# Patient Record
Sex: Female | Born: 1955 | Race: White | Hispanic: No | Smoking: Never smoker
Health system: Southern US, Community
[De-identification: ages and names within clinical notes are randomized; demographics above are authoritative.]

## PROBLEM LIST (undated history)

## (undated) DIAGNOSIS — I10 Essential (primary) hypertension: Secondary | ICD-10-CM

## (undated) HISTORY — PX: CARPAL TUNNEL RELEASE: SHX101

## (undated) HISTORY — PX: APPENDECTOMY: SHX54

---

## 1999-10-17 HISTORY — PX: BREAST BIOPSY: SHX20

## 2018-05-10 ENCOUNTER — Other Ambulatory Visit: Payer: Self-pay | Admitting: Family Medicine

## 2018-05-10 DIAGNOSIS — Z1231 Encounter for screening mammogram for malignant neoplasm of breast: Secondary | ICD-10-CM

## 2018-05-13 ENCOUNTER — Other Ambulatory Visit: Payer: Self-pay | Admitting: Family Medicine

## 2018-05-13 DIAGNOSIS — R1310 Dysphagia, unspecified: Secondary | ICD-10-CM

## 2018-05-15 ENCOUNTER — Ambulatory Visit
Admission: RE | Admit: 2018-05-15 | Discharge: 2018-05-15 | Disposition: A | Discharge status: Home / Self Care | Payer: Managed Care, Other (non HMO) | Source: Ambulatory Visit | Attending: Family Medicine | Admitting: Family Medicine

## 2018-05-15 DIAGNOSIS — R1310 Dysphagia, unspecified: Secondary | ICD-10-CM

## 2018-06-03 ENCOUNTER — Encounter: Payer: Self-pay | Admitting: Radiology

## 2018-06-03 ENCOUNTER — Ambulatory Visit
Admission: RE | Admit: 2018-06-03 | Discharge: 2018-06-03 | Disposition: A | Discharge status: Home / Self Care | Payer: Managed Care, Other (non HMO) | Source: Ambulatory Visit | Attending: Family Medicine | Admitting: Family Medicine

## 2018-06-03 DIAGNOSIS — Z1231 Encounter for screening mammogram for malignant neoplasm of breast: Secondary | ICD-10-CM

## 2018-11-07 DIAGNOSIS — K219 Gastro-esophageal reflux disease without esophagitis: Secondary | ICD-10-CM | POA: Diagnosis not present

## 2018-11-07 DIAGNOSIS — F1020 Alcohol dependence, uncomplicated: Secondary | ICD-10-CM | POA: Diagnosis not present

## 2018-11-07 DIAGNOSIS — F321 Major depressive disorder, single episode, moderate: Secondary | ICD-10-CM | POA: Diagnosis not present

## 2019-03-07 DIAGNOSIS — Z79891 Long term (current) use of opiate analgesic: Secondary | ICD-10-CM | POA: Diagnosis not present

## 2019-04-02 DIAGNOSIS — J029 Acute pharyngitis, unspecified: Secondary | ICD-10-CM | POA: Diagnosis not present

## 2019-04-02 DIAGNOSIS — G44209 Tension-type headache, unspecified, not intractable: Secondary | ICD-10-CM | POA: Diagnosis not present

## 2019-04-04 DIAGNOSIS — J029 Acute pharyngitis, unspecified: Secondary | ICD-10-CM | POA: Diagnosis not present

## 2019-04-04 DIAGNOSIS — G44209 Tension-type headache, unspecified, not intractable: Secondary | ICD-10-CM | POA: Diagnosis not present

## 2019-06-06 DIAGNOSIS — F321 Major depressive disorder, single episode, moderate: Secondary | ICD-10-CM | POA: Diagnosis not present

## 2019-06-06 DIAGNOSIS — F1020 Alcohol dependence, uncomplicated: Secondary | ICD-10-CM | POA: Diagnosis not present

## 2019-06-06 DIAGNOSIS — F419 Anxiety disorder, unspecified: Secondary | ICD-10-CM | POA: Diagnosis not present

## 2019-06-06 DIAGNOSIS — K219 Gastro-esophageal reflux disease without esophagitis: Secondary | ICD-10-CM | POA: Diagnosis not present

## 2019-07-08 DIAGNOSIS — F438 Other reactions to severe stress: Secondary | ICD-10-CM | POA: Diagnosis not present

## 2019-07-14 ENCOUNTER — Other Ambulatory Visit: Payer: Self-pay | Admitting: Family Medicine

## 2019-07-14 DIAGNOSIS — Z1231 Encounter for screening mammogram for malignant neoplasm of breast: Secondary | ICD-10-CM

## 2019-07-15 ENCOUNTER — Ambulatory Visit: Payer: Managed Care, Other (non HMO)

## 2019-07-18 ENCOUNTER — Ambulatory Visit
Admission: RE | Admit: 2019-07-18 | Discharge: 2019-07-18 | Disposition: A | Discharge status: Home / Self Care | Payer: Managed Care, Other (non HMO) | Source: Ambulatory Visit | Attending: Family Medicine | Admitting: Family Medicine

## 2019-07-18 ENCOUNTER — Other Ambulatory Visit: Payer: Self-pay

## 2019-07-18 DIAGNOSIS — F1021 Alcohol dependence, in remission: Secondary | ICD-10-CM | POA: Diagnosis not present

## 2019-07-18 DIAGNOSIS — F438 Other reactions to severe stress: Secondary | ICD-10-CM | POA: Diagnosis not present

## 2019-07-18 DIAGNOSIS — Z1231 Encounter for screening mammogram for malignant neoplasm of breast: Secondary | ICD-10-CM

## 2019-07-29 DIAGNOSIS — F438 Other reactions to severe stress: Secondary | ICD-10-CM | POA: Diagnosis not present

## 2019-07-29 DIAGNOSIS — F1021 Alcohol dependence, in remission: Secondary | ICD-10-CM | POA: Diagnosis not present

## 2019-08-12 DIAGNOSIS — F438 Other reactions to severe stress: Secondary | ICD-10-CM | POA: Diagnosis not present

## 2019-08-12 DIAGNOSIS — F1021 Alcohol dependence, in remission: Secondary | ICD-10-CM | POA: Diagnosis not present

## 2019-08-26 DIAGNOSIS — F1021 Alcohol dependence, in remission: Secondary | ICD-10-CM | POA: Diagnosis not present

## 2019-08-26 DIAGNOSIS — F438 Other reactions to severe stress: Secondary | ICD-10-CM | POA: Diagnosis not present

## 2019-09-07 ENCOUNTER — Emergency Department (HOSPITAL_COMMUNITY)
Admission: EM | Admit: 2019-09-07 | Discharge: 2019-09-08 | Disposition: A | Discharge status: Other Acute Inpatient Hospital | Payer: BC Managed Care – PPO | Attending: Emergency Medicine | Admitting: Emergency Medicine

## 2019-09-07 ENCOUNTER — Emergency Department (HOSPITAL_COMMUNITY): Payer: BC Managed Care – PPO

## 2019-09-07 ENCOUNTER — Other Ambulatory Visit: Payer: Self-pay

## 2019-09-07 ENCOUNTER — Encounter (HOSPITAL_COMMUNITY): Payer: Self-pay

## 2019-09-07 DIAGNOSIS — F4310 Post-traumatic stress disorder, unspecified: Secondary | ICD-10-CM | POA: Insufficient documentation

## 2019-09-07 DIAGNOSIS — Z79899 Other long term (current) drug therapy: Secondary | ICD-10-CM | POA: Insufficient documentation

## 2019-09-07 DIAGNOSIS — F1021 Alcohol dependence, in remission: Secondary | ICD-10-CM

## 2019-09-07 DIAGNOSIS — R45851 Suicidal ideations: Secondary | ICD-10-CM | POA: Diagnosis not present

## 2019-09-07 DIAGNOSIS — F10920 Alcohol use, unspecified with intoxication, uncomplicated: Secondary | ICD-10-CM

## 2019-09-07 DIAGNOSIS — I10 Essential (primary) hypertension: Secondary | ICD-10-CM | POA: Insufficient documentation

## 2019-09-07 DIAGNOSIS — F322 Major depressive disorder, single episode, severe without psychotic features: Secondary | ICD-10-CM | POA: Insufficient documentation

## 2019-09-07 DIAGNOSIS — F1010 Alcohol abuse, uncomplicated: Secondary | ICD-10-CM | POA: Insufficient documentation

## 2019-09-07 DIAGNOSIS — F10129 Alcohol abuse with intoxication, unspecified: Secondary | ICD-10-CM | POA: Diagnosis not present

## 2019-09-07 DIAGNOSIS — Y908 Blood alcohol level of 240 mg/100 ml or more: Secondary | ICD-10-CM | POA: Insufficient documentation

## 2019-09-07 DIAGNOSIS — Z20828 Contact with and (suspected) exposure to other viral communicable diseases: Secondary | ICD-10-CM | POA: Insufficient documentation

## 2019-09-07 DIAGNOSIS — Z03818 Encounter for observation for suspected exposure to other biological agents ruled out: Secondary | ICD-10-CM | POA: Diagnosis not present

## 2019-09-07 HISTORY — DX: Essential (primary) hypertension: I10

## 2019-09-07 LAB — CBC WITH DIFFERENTIAL/PLATELET
Abs Immature Granulocytes: 0.01 10*3/uL (ref 0.00–0.07)
Basophils Absolute: 0 10*3/uL (ref 0.0–0.1)
Basophils Relative: 1 %
Eosinophils Absolute: 0.1 10*3/uL (ref 0.0–0.5)
Eosinophils Relative: 2 %
HCT: 44.1 % (ref 36.0–46.0)
Hemoglobin: 15 g/dL (ref 12.0–15.0)
Immature Granulocytes: 0 %
Lymphocytes Relative: 30 %
Lymphs Abs: 1.2 10*3/uL (ref 0.7–4.0)
MCH: 31.3 pg (ref 26.0–34.0)
MCHC: 34 g/dL (ref 30.0–36.0)
MCV: 91.9 fL (ref 80.0–100.0)
Monocytes Absolute: 0.2 10*3/uL (ref 0.1–1.0)
Monocytes Relative: 5 %
Neutro Abs: 2.4 10*3/uL (ref 1.7–7.7)
Neutrophils Relative %: 62 %
Platelets: 143 10*3/uL — ABNORMAL LOW (ref 150–400)
RBC: 4.8 MIL/uL (ref 3.87–5.11)
RDW: 13.9 % (ref 11.5–15.5)
WBC: 3.9 10*3/uL — ABNORMAL LOW (ref 4.0–10.5)
nRBC: 0 % (ref 0.0–0.2)

## 2019-09-07 LAB — RAPID URINE DRUG SCREEN, HOSP PERFORMED
Amphetamines: NOT DETECTED
Barbiturates: NOT DETECTED
Benzodiazepines: NOT DETECTED
Cocaine: NOT DETECTED
Opiates: NOT DETECTED
Tetrahydrocannabinol: NOT DETECTED

## 2019-09-07 LAB — COMPREHENSIVE METABOLIC PANEL
ALT: 31 U/L (ref 0–44)
AST: 41 U/L (ref 15–41)
Albumin: 4.5 g/dL (ref 3.5–5.0)
Alkaline Phosphatase: 70 U/L (ref 38–126)
Anion gap: 14 (ref 5–15)
BUN: 7 mg/dL — ABNORMAL LOW (ref 8–23)
CO2: 23 mmol/L (ref 22–32)
Calcium: 8.7 mg/dL — ABNORMAL LOW (ref 8.9–10.3)
Chloride: 105 mmol/L (ref 98–111)
Creatinine, Ser: 0.62 mg/dL (ref 0.44–1.00)
GFR calc Af Amer: >60 mL/min (ref >60)
GFR calc non Af Amer: >60 mL/min (ref >60)
Glucose, Bld: 86 mg/dL (ref 70–99)
Potassium: 4.1 mmol/L (ref 3.5–5.1)
Sodium: 142 mmol/L (ref 135–145)
Total Bilirubin: 0.5 mg/dL (ref 0.3–1.2)
Total Protein: 7.2 g/dL (ref 6.5–8.1)

## 2019-09-07 LAB — MAGNESIUM: Magnesium: 2.2 mg/dL (ref 1.7–2.4)

## 2019-09-07 LAB — ACETAMINOPHEN LEVEL: Acetaminophen (Tylenol), Serum: <10 ug/mL — ABNORMAL LOW (ref 10–30)

## 2019-09-07 LAB — SALICYLATE LEVEL: Salicylate Lvl: <7 mg/dL (ref 2.8–30.0)

## 2019-09-07 LAB — ETHANOL: Alcohol, Ethyl (B): 322 mg/dL (ref <10)

## 2019-09-07 MED ORDER — LORAZEPAM 1 MG PO TABS
0.0000 mg | ORAL_TABLET | Freq: Four times a day (QID) | ORAL | Status: DC
  Administered 2019-09-07: 2 mg via ORAL
  Administered 2019-09-08 (×2): 1 mg via ORAL
  Filled 2019-09-07: qty 1
  Filled 2019-09-07: qty 2
  Filled 2019-09-07 (×2): qty 1

## 2019-09-07 MED ORDER — THIAMINE HCL 100 MG/ML IJ SOLN: 100.0000 mg | Freq: Every day | INTRAMUSCULAR | Status: DC

## 2019-09-07 MED ORDER — LORAZEPAM 2 MG/ML IJ SOLN: 0.0000 mg | Freq: Four times a day (QID) | INTRAMUSCULAR | Status: DC

## 2019-09-07 MED ORDER — VITAMIN B-1 100 MG PO TABS
100.0000 mg | ORAL_TABLET | Freq: Every day | ORAL | Status: DC
  Administered 2019-09-07 – 2019-09-08 (×2): 100 mg via ORAL
  Filled 2019-09-07 (×2): qty 1

## 2019-09-07 MED ORDER — LORAZEPAM 1 MG PO TABS: 0.0000 mg | ORAL_TABLET | Freq: Two times a day (BID) | ORAL | Status: DC

## 2019-09-07 MED ORDER — LORAZEPAM 2 MG/ML IJ SOLN: 0.0000 mg | Freq: Two times a day (BID) | INTRAMUSCULAR | Status: DC
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
## 2019-09-07 NOTE — ED Notes (Signed)
Pt A&O x 4, resting at present, complaint of some nausea, no vomiting noted. 1-1 sitter at bedside. No distress noted, calm & cooperative.  Monitoring for safety.

## 2019-09-07 NOTE — ED Notes (Signed)
Date and time results received: 09/07/19 2:23 PM  (use smartphrase ".now" to insert current time)  Test: ETOH Critical Value: 322  Name of Provider Notified: RN notified  Orders Received? Or Actions Taken?: Actions Taken: Primary RN notified

## 2019-09-07 NOTE — ED Triage Notes (Addendum)
Pt BIB EMS from home. Pts husband reported to EMS drinking heavily x3 weeks. Pt admitted to taking 5 50 mg trazodone tablets today. A&O x4. Pt vomited twice with EMS. Pt reported SI to husband this morning probably due to working from home.   Pt reports feeling suicidal x2 days. Pt reports her plan was to overdose on trazodone. Pt admits to drinking roughly one bottle of wine today.  20G LAC 4mg  Zofran CBG 97

## 2019-09-07 NOTE — ED Provider Notes (Signed)
[facility]-EMERGENCY DEPT Provider Note   CSN: [id number] Arrival date & time: 09/07/19  1124     History   Chief Complaint Chief Complaint  Patient presents with  . Alcohol Intoxication  . Suicidal    HPI Terry Chavez is a 63 y.o. female possible history of hypertension brought in by EMS for evaluation of alcohol intoxication, drug overdose with suicidal intent.  Patient reports that over the last several weeks, she has been drinking heavily.  She reports about 2 bottles of wine per day and sometimes drinks occasionally more.  She states that she had completed treatment 3 months ago and went home but as she started having to go back to work and life became more stressful, she started drinking again.  Her last drink was earlier this morning.  Patient also reports that she took 50 mg trazodone tablets x5 today.  She does report that she was trying to kill herself by taking these pills.  She states "I thought it would just be better if at all ended."  She states that she started thinking about suicidal ideation about 2 days ago.  She states that recent events have compounded and caused her to be more stressful.  She states that she recently returned to work and states she is having trouble coping with going back to work.  Additionally, she reports that her marriage has been breaking down.  She does report that she feels safe at home.  She denies any other illicit drug use.  Denies any HI, auditory/visual hallucinations.  She denies any chest pain, difficulty breathing.  Does report some pain to her right hip but does not recall any fall.     The history is provided by the patient.    Past Medical History:  Diagnosis Date  . Hypertension     There are no active problems to display for this patient.   Past Surgical History:  Procedure Laterality Date  . APPENDECTOMY    . BREAST BIOPSY Left 2001   negative, no visible scar  . CARPAL TUNNEL RELEASE        OB History   No obstetric history on file.      Home Medications    Prior to Admission medications   Medication Sig Start Date End Date Taking? Authorizing Provider  FLUoxetine HCl (PROZAC PO) Take by mouth.   Yes [provider]  ibuprofen (ADVIL) 600 MG tablet Take 600 mg by mouth every 6 (six) hours as needed.   Yes [provider]  losartan (COZAAR) 50 MG tablet Take 50 mg by mouth daily. 07/15/19  Yes [provider]  propranolol (INDERAL) 10 MG tablet Take 10 mg by mouth daily. 05/28/19  Yes [provider]  traZODone (DESYREL) 50 MG tablet Take 50 mg by mouth daily. 07/15/19  Yes [provider]    Family History Family History  Problem Relation Age of Onset  . Breast cancer Neg Hx     Social History Social History   Tobacco Use  . Smoking status: Not on file  Substance Use Topics  . Alcohol use: Yes    Alcohol/week: 8.0 standard drinks    Types: 8 Glasses of wine per week    Comment: Two bottles of white wine per day  . Drug use: Never     Allergies   Codeine   Review of Systems Review of Systems  Constitutional: Negative for fever.  Respiratory: Negative for cough and shortness of breath.  Cardiovascular: Negative for chest pain.  Gastrointestinal: Negative for abdominal pain, nausea and vomiting.  Genitourinary: Negative for dysuria and hematuria.  Musculoskeletal:       Right leg pain  Neurological: Negative for headaches.  Psychiatric/Behavioral: Positive for suicidal ideas.  All other systems reviewed and are negative.    Physical Exam Updated Vital Signs BP (!) 121/59 (BP Location: Right Arm)   Pulse 75   Temp 99.6 F (37.6 C) (Oral)   Resp 16   SpO2 97%   Physical Exam Vitals signs and nursing note reviewed.  Constitutional:      Appearance: Normal appearance. She is well-developed.  HENT:     Head: Normocephalic and atraumatic.  Eyes:     General: Lids are normal.      Conjunctiva/sclera: Conjunctivae normal.     Pupils: Pupils are equal, round, and reactive to light.  Neck:     Musculoskeletal: Full passive range of motion without pain.  Cardiovascular:     Rate and Rhythm: Normal rate and regular rhythm.     Pulses: Normal pulses.          Radial pulses are 2+ on the right side and 2+ on the left side.       Dorsalis pedis pulses are 2+ on the right side and 2+ on the left side.     Heart sounds: Normal heart sounds. No murmur. No friction rub. No gallop.   Pulmonary:     Effort: Pulmonary effort is normal.     Breath sounds: Normal breath sounds.     Comments: Lungs clear to auscultation bilaterally.  Symmetric chest rise.  No wheezing, rales, rhonchi. Abdominal:     Palpations: Abdomen is soft. Abdomen is not rigid.     Tenderness: There is no abdominal tenderness. There is no guarding.     Comments: Abdomen is soft, non-distended, non-tender. No rigidity, No guarding. No peritoneal signs.  Musculoskeletal: Normal range of motion.     Comments: Tenderness palpation the right hip.  No deformity or crepitus noted.  Good range of motion.  No bony tenderness noted to distal femur, right knee, right tib-fib.  Skin:    General: Skin is warm and dry.     Capillary Refill: Capillary refill takes less than 2 seconds.  Neurological:     Mental Status: She is alert and oriented to person, place, and time.  Psychiatric:        Speech: Speech normal.        Thought Content: Thought content includes suicidal ideation. Thought content does not include homicidal ideation. Thought content includes suicidal plan. Thought content does not include homicidal plan.      ED Treatments / Results  Labs (all labs ordered are listed, but only abnormal results are displayed) Labs Reviewed  CBC WITH DIFFERENTIAL/PLATELET - Abnormal; Notable for the following components:      Result Value   WBC 3.9 (*)    Platelets 143 (*)    All other components within normal limits   ETHANOL - Abnormal; Notable for the following components:   Alcohol, Ethyl (B) 322 (*)    All other components within normal limits  ACETAMINOPHEN LEVEL - Abnormal; Notable for the following components:   Acetaminophen (Tylenol), Serum <10 (*)    All other components within normal limits  COMPREHENSIVE METABOLIC PANEL - Abnormal; Notable for the following components:   BUN 7 (*)    Calcium 8.7 (*)    All other components within normal limits  SARS CORONAVIRUS 2 (TAT 6-24 HRS)  SALICYLATE LEVEL  RAPID URINE DRUG SCREEN, HOSP PERFORMED  MAGNESIUM    EKG EKG Interpretation  Date/Time:  Sunday September 07 2019 12:45:23 EST Ventricular Rate:  77 PR Interval:  166 QRS Duration: 84 QT Interval:  412 QTC Calculation: 466 R Axis:   -56 Text Interpretation: Normal sinus rhythm Left axis deviation Nonspecific T wave abnormality Abnormal ECG No old tracing to compare Confirmed by Goldston, Scott ([id number]) on 09/07/2019 12:52:40 PM   Radiology Dg Hip Unilat W Or Wo Pelvis 2-3 Views Right  Result Date: 09/07/2019 CLINICAL DATA:  Hip pain EXAM: DG HIP (WITH OR WITHOUT PELVIS) 2-3V RIGHT COMPARISON:  None. FINDINGS: There is no evidence of hip fracture or dislocation. A well corticated osseous fragment is seen near the greater trochanter, likely degenerative. 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 IMPRESSION: No acute findings are evident. 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 Electronically Signed   By: Tyler  Litton M.D.   On: 09/07/2019 18:14    Procedures Procedures (including critical care time)  Medications Ordered in ED Medications  LORazepam (ATIVAN) injection 0-4 mg (0 mg Intravenous Not Given 09/07/19 1621)    Or  LORazepam (ATIVAN) tablet 0-4 mg ( Oral See Alternative 09/07/19 1621)  LORazepam (ATIVAN) injection 0-4 mg (has no administration in time range)     Or  LORazepam (ATIVAN) tablet 0-4 mg (has no administration in time range)  thiamine (VITAMIN B-1) tablet 100 mg (100 mg Oral Given 09/07/19 1612)    Or  thiamine (B-1) injection 100 mg ( Intravenous See Alternative 09/07/19 1612)     Initial Impression / Assessment and Plan / ED Course  I have reviewed the triage vital signs and the nursing notes.  Pertinent labs & imaging results that were available during my care of the patient were reviewed by me and considered in my medical decision making (see chart for details).        63  year old female who presents for evaluation of alcohol intoxication, suicidal ideation.  She reports history of alcohol abuse and last completed treatment about 3 months ago.  Reports that since being out and back at home, she has had compounding stress, including breakdown of marriage, work duties which she feels like is contributed to her relapse.  Reports she drinks 2 bottles of wine a day.  Last drink was earlier today.  Also reports taking 50 mg of trazodone x5 earlier today with the intent of suicide.  Lacerated think about suicide 2 days ago.  No HI, auditory/visual hallucinations.  Patient is voluntary for treatment at this time.  Plan for medical clearance labs, TTS consultation.  Acetaminophen level, salicylate level unremarkable.  Ethanol level is 322.  CBC shows leukopenia of 3.9.  CMP is unremarkable.  Per behavioral health, patient meets patient criteria.  Patient placed in psych hold with CIWA protocol.  I discussed with patient.  She is voluntary at this time and is agreeable.  She is complaining of some pain to her right leg.  She does not recall any falls.  We will plan for x-ray.   X-ray reviewed.  Negative acute bony abnormalities.  Patient is medically cleared and awaiting placement.  Portions of this note were generated with Scientist, clinical (histocompatibility and immunogenetics)Dragon dictation software. Dictation errors may occur despite best attempts at proofreading.   Final Clinical  Impressions(s) / ED Diagnoses   Final diagnoses:  Alcoholic intoxication without complication (HCC)  Suicidal  ideation    ED Discharge Orders    None       Rosana Hoes 09/07/19 2053    Pricilla Loveless, MD 09/08/19 1022

## 2019-09-08 DIAGNOSIS — F1021 Alcohol dependence, in remission: Secondary | ICD-10-CM

## 2019-09-08 DIAGNOSIS — F322 Major depressive disorder, single episode, severe without psychotic features: Secondary | ICD-10-CM | POA: Diagnosis not present

## 2019-09-08 LAB — SARS CORONAVIRUS 2 (TAT 6-24 HRS): SARS Coronavirus 2: NEGATIVE

## 2019-09-08 NOTE — BH Assessment (Signed)
[facility] Assessment Progress Note  Per Letitia Libra, FNP, this pt does not require psychiatric hospitalization at this time.  Pt is to be discharged from [facility], provided that pt's husband can be reached to make arrangements to secure the household.  At 11:25 this Probation officer called the husband, Eligha Bridegroom [phone]).  He agrees to remove alcohol from the household.  He will also secure all medications in the household, including over-the-counter medications, and will administer them to the pt himself.  He reports that there are no firearms in the home.  He agrees to present at [facility] around 12:30 to pick pt up.  Discharge instructions advise pt to continue treatment with her current providers and to continue Alcoholics Anonymous meetings.  Pt's nurse, Tilda Franco, has been notified.  Jalene Mullet, [facility] Triage Specialist [phone]

## 2019-09-08 NOTE — Discharge Instructions (Signed)
For your behavioral health needs you are advised to continue treatment with your current outpatient providers.  You are also advised to continue attending Alcoholics Anonymous meetings.

## 2019-09-08 NOTE — Consult Note (Signed)
[facility]  09/08/2019 11:31 AM Terry GuMargaret Chavez  MRN:  [id number] Principal Problem: Alcohol use disorder, severe, in early remission [facility](HCC) Discharge Diagnoses: Principal Problem:   Alcohol use disorder, severe, in early remission [facility](HCC)   Subjective: Patient seen by nurse practitioner.  Patient states "I am doing better than last night, I had been drinking excessively and I took extra trazodone for sleep because I had too much alcohol."  Patient reports drinking alcohol for about 2 weeks.  Patient reports completing fellowship home program in mid August of this year.Patient reports "as soon as I left I went back to work."  Patient states "the election and Covid are disturbing and my husband felt like we needed to have more intimacy"  As additional triggers. Patient reports stressor is her "difficult job."  Patient denies history of suicide attempts.  Patient denies suicidal ideations and homicidal ideations.  Patient denies access to weapons.  Patient denies hallucinations. Patient reports current couples counseling with husband Terry NeedleMichael of 17 years.  Patient reports current talk therapy.  Patient reports attending [id number] AA meetings in 90 days, patient reports AA sponsor who is supportive at this time. Patient seen along with Dr. Lucianne MussKumar who recommends discharge.  Total Time spent with patient: 30 minutes  Past Psychiatric History: Alcohol Use Disorder  Past Medical History:  Past Medical History:  Diagnosis Date  . Hypertension     Past Surgical History:  Procedure Laterality Date  . APPENDECTOMY    . BREAST BIOPSY Left 2001   negative, no visible scar  . CARPAL TUNNEL RELEASE     Family History:  Family History  Problem Relation Age of Onset  . Breast cancer Neg Hx    Family Psychiatric  History: Denies Social History:  Social History   Substance and Sexual Activity  Alcohol Use Yes  . Alcohol/week: 8.0 standard drinks  . Types: 8 Glasses of wine per week   Comment: Two bottles of white wine per day     Social History   Substance and Sexual Activity  Drug Use Never    Social History   Socioeconomic History  . Marital status: Married    Spouse name: Terry NeedleMichael  . Number of children: Not on file  . Years of education: Not on file  . Highest education level: Not on file  Occupational History  . Occupation: employed  Engineer, productionocial Needs  . Financial resource strain: Not on file  . Food insecurity    Worry: Not on file    Inability: Not on file  . Transportation needs    Medical: Not on file    Non-medical: Not on file  Tobacco Use  . Smoking status: Not on file  Substance and Sexual Activity  . Alcohol use: Yes    Alcohol/week: 8.0 standard drinks    Types: 8 Glasses of wine per week    Comment: Two bottles of white wine per day  . Drug use: Never  . Sexual activity: Yes  Lifestyle  . Physical activity    Days per week: Not on file    Minutes per session: Not on file  . Stress: Not on file  Relationships  . Social Musicianconnections    Talks on phone: Not on file    Gets together: Not on file    Attends religious service: Not on file    Active member of club or organization: Not on file    Attends meetings of clubs or organizations: Not on file  Relationship status: Not on file  Other Topics Concern  . Not on file  Social History Narrative   Pt lives in [place] with husband Terry Needle.  She is not followed by an outpatient psychiatrist at this time.  She is followed by Dr. Lorenda Cahill, [facility] for outpatient therapy.    Has this patient used any form of tobacco in the last 30 days? (Cigarettes, Smokeless Tobacco, Cigars, and/or Pipes) A prescription for an FDA-approved tobacco cessation medication was offered at discharge and the patient refused  Current Medications: Current Facility-Administered Medications  Medication Dose Route Frequency Provider Last Rate Last Dose  . LORazepam (ATIVAN) injection 0-4 mg  0-4 mg  Intravenous Q6H Layden, Lindsey A, PA-C       Or  . LORazepam (ATIVAN) tablet 0-4 mg  0-4 mg Oral Q6H Layden, Lindsey A, PA-C   1 mg at 09/08/19 0934  . [START ON 09/09/2019] LORazepam (ATIVAN) injection 0-4 mg  0-4 mg Intravenous Q12H Graciella Freer A, PA-C       Or  . Melene Muller ON 09/09/2019] LORazepam (ATIVAN) tablet 0-4 mg  0-4 mg Oral Q12H Layden, Lindsey A, PA-C      . thiamine (VITAMIN B-1) tablet 100 mg  100 mg Oral Daily Graciella Freer A, PA-C   100 mg at 09/08/19 7654   Or  . thiamine (B-1) injection 100 mg  100 mg Intravenous Daily Maxwell Caul, PA-C       Current Outpatient Medications  Medication Sig Dispense Refill  . FLUoxetine HCl (PROZAC PO) Take by mouth.    Marland Kitchen ibuprofen (ADVIL) 600 MG tablet Take 600 mg by mouth every 6 (six) hours as needed.    Marland Kitchen losartan (COZAAR) 50 MG tablet Take 50 mg by mouth daily.    . propranolol (INDERAL) 10 MG tablet Take 10 mg by mouth daily.    . traZODone (DESYREL) 50 MG tablet Take 50 mg by mouth daily.     PTA Medications: (Not in a hospital admission)   Musculoskeletal: Strength & Muscle Tone: within normal limits Gait & Station: normal Patient leans: N/A  Psychiatric Specialty Exam: Physical Exam  ROS  Blood pressure (!) 182/93, pulse (!) 101, temperature 98.4 F (36.9 C), temperature source Oral, resp. rate 18, SpO2 97 %.There is no height or weight on file to calculate BMI.  General Appearance: Casual  Eye Contact:  Good  Speech:  Clear and Coherent and Normal Rate  Volume:  Normal  Mood:  Euthymic  Affect:  Appropriate and Congruent  Thought Process:  Coherent, Goal Directed and Descriptions of Associations: Intact  Orientation:  Full (Time, Place, and Person)  Thought Content:  WDL and Logical  Suicidal Thoughts:  No  Homicidal Thoughts:  No  Memory:  Immediate;   Good Recent;   Good Remote;   Good  Judgement:  Good  Insight:  Fair  Psychomotor Activity:  Normal  Concentration:  Concentration: Good and  Attention Span: Good  Recall:  Good  Fund of Knowledge:  Good  Language:  Good  Akathisia:  No  Handed:  Right  AIMS (if indicated):     Assets:  Communication Skills Desire for Improvement Financial Resources/Insurance Housing Intimacy Physical Health Social Support  ADL's:  Intact  Cognition:  WNL  Sleep:        Demographic Factors:  Caucasian  Loss Factors: NA  Historical Factors: NA  Risk Reduction Factors:   Living with another person, especially a relative, Positive social support, Positive therapeutic  relationship and Positive coping skills or problem solving skills  Continued Clinical Symptoms:  Alcohol/Substance Abuse/Dependencies  Cognitive Features That Contribute To Risk:  None    Suicide Risk:  Minimal: No identifiable suicidal ideation.  Patients presenting with no risk factors but with morbid ruminations; may be classified as minimal risk based on the severity of the depressive symptoms    Plan Of Care/Follow-up recommendations:  Other:  Follow up with outpatient provider  Disposition: Discharge home Emmaline Kluver, [place] 09/08/2019, 11:31 AM

## 2019-09-20 DIAGNOSIS — F438 Other reactions to severe stress: Secondary | ICD-10-CM | POA: Diagnosis not present

## 2019-09-20 DIAGNOSIS — F1021 Alcohol dependence, in remission: Secondary | ICD-10-CM | POA: Diagnosis not present

## 2019-09-27 DIAGNOSIS — F438 Other reactions to severe stress: Secondary | ICD-10-CM | POA: Diagnosis not present

## 2019-09-27 DIAGNOSIS — F1021 Alcohol dependence, in remission: Secondary | ICD-10-CM | POA: Diagnosis not present

## 2019-10-02 DIAGNOSIS — F1021 Alcohol dependence, in remission: Secondary | ICD-10-CM | POA: Diagnosis not present

## 2019-10-02 DIAGNOSIS — F438 Other reactions to severe stress: Secondary | ICD-10-CM | POA: Diagnosis not present

## 2019-10-15 DIAGNOSIS — H35033 Hypertensive retinopathy, bilateral: Secondary | ICD-10-CM | POA: Diagnosis not present

## 2019-10-23 DIAGNOSIS — F1021 Alcohol dependence, in remission: Secondary | ICD-10-CM | POA: Diagnosis not present

## 2019-10-23 DIAGNOSIS — F438 Other reactions to severe stress: Secondary | ICD-10-CM | POA: Diagnosis not present

## 2019-10-30 DIAGNOSIS — F438 Other reactions to severe stress: Secondary | ICD-10-CM | POA: Diagnosis not present

## 2019-10-30 DIAGNOSIS — F1021 Alcohol dependence, in remission: Secondary | ICD-10-CM | POA: Diagnosis not present

## 2019-11-06 DIAGNOSIS — F1021 Alcohol dependence, in remission: Secondary | ICD-10-CM | POA: Diagnosis not present

## 2019-11-06 DIAGNOSIS — F438 Other reactions to severe stress: Secondary | ICD-10-CM | POA: Diagnosis not present

## 2019-11-11 DIAGNOSIS — F438 Other reactions to severe stress: Secondary | ICD-10-CM | POA: Diagnosis not present

## 2019-11-11 DIAGNOSIS — F1021 Alcohol dependence, in remission: Secondary | ICD-10-CM | POA: Diagnosis not present

## 2019-11-27 DIAGNOSIS — F438 Other reactions to severe stress: Secondary | ICD-10-CM | POA: Diagnosis not present

## 2019-11-27 DIAGNOSIS — F1021 Alcohol dependence, in remission: Secondary | ICD-10-CM | POA: Diagnosis not present

## 2019-12-04 DIAGNOSIS — F438 Other reactions to severe stress: Secondary | ICD-10-CM | POA: Diagnosis not present

## 2019-12-04 DIAGNOSIS — F1021 Alcohol dependence, in remission: Secondary | ICD-10-CM | POA: Diagnosis not present

## 2019-12-11 DIAGNOSIS — F1021 Alcohol dependence, in remission: Secondary | ICD-10-CM | POA: Diagnosis not present

## 2019-12-11 DIAGNOSIS — F438 Other reactions to severe stress: Secondary | ICD-10-CM | POA: Diagnosis not present

## 2019-12-20 DIAGNOSIS — F438 Other reactions to severe stress: Secondary | ICD-10-CM | POA: Diagnosis not present

## 2019-12-20 DIAGNOSIS — F1021 Alcohol dependence, in remission: Secondary | ICD-10-CM | POA: Diagnosis not present

## 2019-12-25 DIAGNOSIS — F438 Other reactions to severe stress: Secondary | ICD-10-CM | POA: Diagnosis not present

## 2019-12-25 DIAGNOSIS — F1021 Alcohol dependence, in remission: Secondary | ICD-10-CM | POA: Diagnosis not present

## 2020-01-06 DIAGNOSIS — F438 Other reactions to severe stress: Secondary | ICD-10-CM | POA: Diagnosis not present

## 2020-01-06 DIAGNOSIS — F1021 Alcohol dependence, in remission: Secondary | ICD-10-CM | POA: Diagnosis not present

## 2020-01-28 DIAGNOSIS — F1021 Alcohol dependence, in remission: Secondary | ICD-10-CM | POA: Diagnosis not present

## 2020-01-28 DIAGNOSIS — F438 Other reactions to severe stress: Secondary | ICD-10-CM | POA: Diagnosis not present

## 2020-02-06 DIAGNOSIS — F1021 Alcohol dependence, in remission: Secondary | ICD-10-CM | POA: Diagnosis not present

## 2020-02-06 DIAGNOSIS — F438 Other reactions to severe stress: Secondary | ICD-10-CM | POA: Diagnosis not present

## 2020-02-12 DIAGNOSIS — F438 Other reactions to severe stress: Secondary | ICD-10-CM | POA: Diagnosis not present

## 2020-02-12 DIAGNOSIS — F1021 Alcohol dependence, in remission: Secondary | ICD-10-CM | POA: Diagnosis not present

## 2020-03-11 DIAGNOSIS — F438 Other reactions to severe stress: Secondary | ICD-10-CM | POA: Diagnosis not present

## 2020-03-11 DIAGNOSIS — F1020 Alcohol dependence, uncomplicated: Secondary | ICD-10-CM | POA: Diagnosis not present

## 2020-04-03 DIAGNOSIS — F438 Other reactions to severe stress: Secondary | ICD-10-CM | POA: Diagnosis not present

## 2020-04-03 DIAGNOSIS — F1020 Alcohol dependence, uncomplicated: Secondary | ICD-10-CM | POA: Diagnosis not present

## 2020-04-17 DIAGNOSIS — F438 Other reactions to severe stress: Secondary | ICD-10-CM | POA: Diagnosis not present

## 2020-04-17 DIAGNOSIS — F1021 Alcohol dependence, in remission: Secondary | ICD-10-CM | POA: Diagnosis not present

## 2020-04-20 DIAGNOSIS — F1021 Alcohol dependence, in remission: Secondary | ICD-10-CM | POA: Diagnosis not present

## 2020-04-20 DIAGNOSIS — F438 Other reactions to severe stress: Secondary | ICD-10-CM | POA: Diagnosis not present

## 2020-05-30 ENCOUNTER — Emergency Department (HOSPITAL_COMMUNITY): Payer: BC Managed Care – PPO

## 2020-05-30 ENCOUNTER — Inpatient Hospital Stay (HOSPITAL_COMMUNITY)
Admission: EM | Admit: 2020-05-30 | Discharge: 2020-06-01 | DRG: 641 | Disposition: A | Discharge status: Home Health | Payer: BC Managed Care – PPO | Attending: Family Medicine | Admitting: Family Medicine

## 2020-05-30 ENCOUNTER — Encounter (HOSPITAL_COMMUNITY): Payer: Self-pay | Admitting: Emergency Medicine

## 2020-05-30 ENCOUNTER — Other Ambulatory Visit: Payer: Self-pay

## 2020-05-30 DIAGNOSIS — Z79899 Other long term (current) drug therapy: Secondary | ICD-10-CM

## 2020-05-30 DIAGNOSIS — Z20822 Contact with and (suspected) exposure to covid-19: Secondary | ICD-10-CM | POA: Diagnosis present

## 2020-05-30 DIAGNOSIS — I493 Ventricular premature depolarization: Secondary | ICD-10-CM | POA: Diagnosis present

## 2020-05-30 DIAGNOSIS — E876 Hypokalemia: Secondary | ICD-10-CM | POA: Diagnosis not present

## 2020-05-30 DIAGNOSIS — I10 Essential (primary) hypertension: Secondary | ICD-10-CM | POA: Diagnosis not present

## 2020-05-30 DIAGNOSIS — F329 Major depressive disorder, single episode, unspecified: Secondary | ICD-10-CM | POA: Diagnosis not present

## 2020-05-30 DIAGNOSIS — R404 Transient alteration of awareness: Secondary | ICD-10-CM | POA: Diagnosis not present

## 2020-05-30 DIAGNOSIS — R000 Tachycardia, unspecified: Secondary | ICD-10-CM | POA: Diagnosis not present

## 2020-05-30 DIAGNOSIS — Z885 Allergy status to narcotic agent status: Secondary | ICD-10-CM

## 2020-05-30 DIAGNOSIS — R531 Weakness: Secondary | ICD-10-CM

## 2020-05-30 DIAGNOSIS — F1020 Alcohol dependence, uncomplicated: Secondary | ICD-10-CM | POA: Diagnosis present

## 2020-05-30 DIAGNOSIS — Z9181 History of falling: Secondary | ICD-10-CM | POA: Diagnosis not present

## 2020-05-30 DIAGNOSIS — E871 Hypo-osmolality and hyponatremia: Secondary | ICD-10-CM | POA: Diagnosis not present

## 2020-05-30 DIAGNOSIS — E872 Acidosis, unspecified: Secondary | ICD-10-CM

## 2020-05-30 DIAGNOSIS — R4182 Altered mental status, unspecified: Secondary | ICD-10-CM

## 2020-05-30 DIAGNOSIS — R296 Repeated falls: Secondary | ICD-10-CM | POA: Diagnosis not present

## 2020-05-30 DIAGNOSIS — R5381 Other malaise: Secondary | ICD-10-CM | POA: Diagnosis not present

## 2020-05-30 DIAGNOSIS — F32A Depression, unspecified: Secondary | ICD-10-CM | POA: Diagnosis present

## 2020-05-30 LAB — CBC
HCT: 44.7 % (ref 36.0–46.0)
Hemoglobin: 16 g/dL — ABNORMAL HIGH (ref 12.0–15.0)
MCH: 32 pg (ref 26.0–34.0)
MCHC: 35.8 g/dL (ref 30.0–36.0)
MCV: 89.4 fL (ref 80.0–100.0)
Platelets: 223 10*3/uL (ref 150–400)
RBC: 5 MIL/uL (ref 3.87–5.11)
RDW: 15.4 % (ref 11.5–15.5)
WBC: 9.8 10*3/uL (ref 4.0–10.5)
nRBC: 0 % (ref 0.0–0.2)

## 2020-05-30 LAB — DIFFERENTIAL
Abs Immature Granulocytes: 0.07 10*3/uL (ref 0.00–0.07)
Basophils Absolute: 0 10*3/uL (ref 0.0–0.1)
Basophils Relative: 0 %
Eosinophils Absolute: 0 10*3/uL (ref 0.0–0.5)
Eosinophils Relative: 0 %
Immature Granulocytes: 1 %
Lymphocytes Relative: 7 %
Lymphs Abs: 0.7 10*3/uL (ref 0.7–4.0)
Monocytes Absolute: 0.8 10*3/uL (ref 0.1–1.0)
Monocytes Relative: 8 %
Neutro Abs: 8.2 10*3/uL — ABNORMAL HIGH (ref 1.7–7.7)
Neutrophils Relative %: 84 %

## 2020-05-30 LAB — HIV ANTIBODY (ROUTINE TESTING W REFLEX): HIV Screen 4th Generation wRfx: NONREACTIVE

## 2020-05-30 LAB — I-STAT CHEM 8, ED
BUN: 18 mg/dL (ref 8–23)
Calcium, Ion: 1.15 mmol/L (ref 1.15–1.40)
Chloride: 92 mmol/L — ABNORMAL LOW (ref 98–111)
Creatinine, Ser: 0.7 mg/dL (ref 0.44–1.00)
Glucose, Bld: 123 mg/dL — ABNORMAL HIGH (ref 70–99)
HCT: 47 % — ABNORMAL HIGH (ref 36.0–46.0)
Hemoglobin: 16 g/dL — ABNORMAL HIGH (ref 12.0–15.0)
Potassium: 3.2 mmol/L — ABNORMAL LOW (ref 3.5–5.1)
Sodium: 129 mmol/L — ABNORMAL LOW (ref 135–145)
TCO2: 19 mmol/L — ABNORMAL LOW (ref 22–32)

## 2020-05-30 LAB — COMPREHENSIVE METABOLIC PANEL
ALT: 64 U/L — ABNORMAL HIGH (ref 0–44)
AST: 48 U/L — ABNORMAL HIGH (ref 15–41)
Albumin: 4.9 g/dL (ref 3.5–5.0)
Alkaline Phosphatase: 62 U/L (ref 38–126)
Anion gap: 21 — ABNORMAL HIGH (ref 5–15)
BUN: 17 mg/dL (ref 8–23)
CO2: 17 mmol/L — ABNORMAL LOW (ref 22–32)
Calcium: 9.9 mg/dL (ref 8.9–10.3)
Chloride: 91 mmol/L — ABNORMAL LOW (ref 98–111)
Creatinine, Ser: 0.94 mg/dL (ref 0.44–1.00)
GFR calc Af Amer: >60 mL/min (ref >60)
GFR calc non Af Amer: >60 mL/min (ref >60)
Glucose, Bld: 131 mg/dL — ABNORMAL HIGH (ref 70–99)
Potassium: 3.3 mmol/L — ABNORMAL LOW (ref 3.5–5.1)
Sodium: 129 mmol/L — ABNORMAL LOW (ref 135–145)
Total Bilirubin: 2.4 mg/dL — ABNORMAL HIGH (ref 0.3–1.2)
Total Protein: 7.6 g/dL (ref 6.5–8.1)

## 2020-05-30 LAB — PROTIME-INR
INR: 1 (ref 0.8–1.2)
Prothrombin Time: 12.6 seconds (ref 11.4–15.2)

## 2020-05-30 LAB — MAGNESIUM: Magnesium: 2.1 mg/dL (ref 1.7–2.4)

## 2020-05-30 LAB — TSH: TSH: 2.544 u[IU]/mL (ref 0.350–4.500)

## 2020-05-30 LAB — SARS CORONAVIRUS 2 BY RT PCR (HOSPITAL ORDER, PERFORMED IN ~~LOC~~ HOSPITAL LAB): SARS Coronavirus 2: NEGATIVE

## 2020-05-30 LAB — APTT: aPTT: 25 seconds (ref 24–36)

## 2020-05-30 LAB — LACTIC ACID, PLASMA: Lactic Acid, Venous: 2 mmol/L (ref 0.5–1.9)

## 2020-05-30 LAB — CBG MONITORING, ED: Glucose-Capillary: 147 mg/dL — ABNORMAL HIGH (ref 70–99)

## 2020-05-30 MED ORDER — LORAZEPAM 1 MG PO TABS: 1.0000 mg | ORAL_TABLET | ORAL | Status: DC | PRN

## 2020-05-30 MED ORDER — TRAZODONE HCL 50 MG PO TABS
50.0000 mg | ORAL_TABLET | Freq: Every evening | ORAL | Status: DC | PRN
  Administered 2020-05-30 – 2020-05-31 (×2): 50 mg via ORAL
  Filled 2020-05-30 (×2): qty 1

## 2020-05-30 MED ORDER — ONDANSETRON HCL 4 MG PO TABS: 4.0000 mg | ORAL_TABLET | Freq: Four times a day (QID) | ORAL | Status: DC | PRN

## 2020-05-30 MED ORDER — ONDANSETRON HCL 4 MG/2ML IJ SOLN: 4.0000 mg | Freq: Four times a day (QID) | INTRAMUSCULAR | Status: DC | PRN

## 2020-05-30 MED ORDER — ACETAMINOPHEN 325 MG PO TABS: 650.0000 mg | ORAL_TABLET | Freq: Four times a day (QID) | ORAL | Status: DC | PRN

## 2020-05-30 MED ORDER — ACETAMINOPHEN 650 MG RE SUPP: 650.0000 mg | Freq: Four times a day (QID) | RECTAL | Status: DC | PRN

## 2020-05-30 MED ORDER — THIAMINE HCL 100 MG/ML IJ SOLN
Freq: Once | INTRAVENOUS | Status: AC
  Filled 2020-05-30: qty 1000

## 2020-05-30 MED ORDER — SODIUM CHLORIDE 0.9% FLUSH
3.0000 mL | Freq: Once | INTRAVENOUS | Status: DC
Start: 2020-05-30 — End: 2020-06-01

## 2020-05-30 MED ORDER — SODIUM CHLORIDE 0.9 % IV BOLUS
500.0000 mL | Freq: Once | INTRAVENOUS | Status: AC
  Administered 2020-05-30: 500 mL via INTRAVENOUS

## 2020-05-30 MED ORDER — ENOXAPARIN SODIUM 40 MG/0.4ML ~~LOC~~ SOLN
40.0000 mg | SUBCUTANEOUS | Status: DC
  Administered 2020-05-30 – 2020-06-01 (×3): 40 mg via SUBCUTANEOUS
  Filled 2020-05-30 (×3): qty 0.4

## 2020-05-30 MED ORDER — LOSARTAN POTASSIUM 50 MG PO TABS
75.0000 mg | ORAL_TABLET | Freq: Every day | ORAL | Status: DC
  Administered 2020-05-30 – 2020-06-01 (×3): 75 mg via ORAL
  Filled 2020-05-30 (×3): qty 2

## 2020-05-30 MED ORDER — PROPRANOLOL HCL 10 MG PO TABS: 10.0000 mg | ORAL_TABLET | Freq: Every day | ORAL | Status: DC

## 2020-05-30 MED ORDER — LORAZEPAM 2 MG/ML IJ SOLN: 1.0000 mg | INTRAMUSCULAR | Status: DC | PRN

## 2020-05-30 MED ORDER — POTASSIUM CHLORIDE CRYS ER 20 MEQ PO TBCR
40.0000 meq | EXTENDED_RELEASE_TABLET | Freq: Two times a day (BID) | ORAL | Status: AC
  Administered 2020-05-30 – 2020-05-31 (×2): 40 meq via ORAL
  Filled 2020-05-30 (×2): qty 2

## 2020-05-30 NOTE — ED Notes (Signed)
Attempted report x 2 

## 2020-05-30 NOTE — TOC Initial Note (Signed)
Transition of Care [facility]) - Initial/Assessment Note    Patient Details  Name: Terry Chavez MRN: [id number] Date of Birth: [birth year]  Transition of Care [facility]) CM/SW Contact:    Lockie Pares, RN Phone Number: 05/30/2020, 3:04 PM  Clinical Narrative:                  Patient was brought to the ED found on sidewalk . Had been drinking water. Had been drinking large amounts of alchohol according to pateint, did not remember. Moved out of her house into a apartment, had a [place] at her old home apparently had tripped and fallen in [place] according to her friend Darl Pikes, who is her AA sponsor. She has moved to a apartment, her "stuff is there but not organized"Patient states hse does have a PCP , and insurance, and a way to get medications.    Barriers to Discharge: Continued Medical Work up   Patient Goals and CMS Choice        Expected Discharge Plan and Services                                                Prior Living Arrangements/Services                       Activities of Daily Living      Permission Sought/Granted                  Emotional Assessment              Admission diagnosis:  Triage  Patient Active Problem List   Diagnosis Date Noted  . Alcohol use disorder, severe, in early remission (HCC) 09/08/2019   PCP:  Sigmund Hazel, MD Pharmacy:   [facility] [address], [place] - [address] [place] [address] Phone: [phone] Fax: [phone]     Social Determinants of Health (SDOH) Interventions    Readmission Risk Interventions No flowsheet data found.

## 2020-05-30 NOTE — ED Triage Notes (Signed)
Pt. Stated, I don't remember anything that is important to me. My bed, my phone. I have no food, Lavenia Atlas been drinking out of faucet. I think my car is in the parking lot. Ive been lying around in the house just drinking water. Im by myself.

## 2020-05-30 NOTE — ED Notes (Signed)
Pt. Has black eyes and had no idea how she fell. My glasses are smashed

## 2020-05-30 NOTE — ED Provider Notes (Signed)
[facility]   CSN: [id number] Arrival date & time: 05/30/20  1047     History Chief Complaint  Patient presents with  . Altered Mental Status    Terry Chavez is a 64 y.o. female.  HPI     Had falls Reports she is an alcoholic, stopped 2 years ago, then started again recently and binged, then stopped and had falls-doesn't remember movers, doesn't remember much from those days, reports she believes she was withdrawing, not sure if had seizures In process of moving from one house to another, boxes everywhere Has not eaten or having anything to drink other than water for 3 days Couldn't get to food in the house, no food in house, didn't feel like could drive Difficulty walking because of weakness, low appetite   Not sure where charger is so couldn't call anyone Terry FlesherWent outside today and asked neighbors to call ambulance but they didn't hear and she layed down outside and two people passed by and called ambulance Thinks had fall 4-5 days ago   Past Medical History:  Diagnosis Date  . Hypertension     Patient Active Problem List   Diagnosis Date Noted  . Hyponatremia 05/30/2020  . Hypokalemia 05/30/2020  . Depression 05/30/2020  . Generalized weakness 05/30/2020  . Alcohol use disorder, severe, in early remission (HCC) 09/08/2019    Past Surgical History:  Procedure Laterality Date  . APPENDECTOMY    . BREAST BIOPSY Left 2001   negative, no visible scar  . CARPAL TUNNEL RELEASE       OB History   No obstetric history on file.     Family History  Problem Relation Age of Onset  . Breast cancer Neg Hx     Social History   Tobacco Use  . Smoking status: Never Smoker  . Smokeless tobacco: Never Used  Substance Use Topics  . Alcohol use: Yes    Alcohol/week: 8.0 standard drinks    Types: 8 Glasses of wine per week    Comment: Two bottles of white wine per day  . Drug use: Never    Home  Medications Prior to Admission medications   Medication Sig Start Date End Date Taking? Authorizing Provider  busPIRone (BUSPAR) 15 MG tablet Take 15 mg by mouth See admin instructions. Take one tablet (15 mg) by mouth every morning, may also take one tablet (15 mg) at night as needed for anxiety 04/28/20  Yes [provider]  FLUoxetine (PROZAC) 40 MG capsule Take 80 mg by mouth daily.   Yes [provider]  losartan (COZAAR) 50 MG tablet Take 50 mg by mouth daily. 07/15/19  Yes [provider]  Multiple Vitamin (MULTIVITAMIN WITH MINERALS) TABS tablet Take 1 tablet by mouth daily.   Yes [provider]  naphazoline-pheniramine (NAPHCON-A) 0.025-0.3 % ophthalmic solution Place 1 drop into both eyes 4 (four) times daily as needed for eye irritation.   Yes [provider]  naproxen sodium (ALEVE) 220 MG tablet Take 440 mg by mouth daily as needed (pain/headache).   Yes [provider]  propranolol (INDERAL) 10 MG tablet Take 10 mg by mouth at bedtime as needed (panic attacks).  05/28/19  Yes [provider]  traZODone (DESYREL) 50 MG tablet Take 50-100 mg by mouth at bedtime.  07/15/19  Yes [provider]    Allergies    Codeine  Review of Systems   Review of Systems  Constitutional: Positive for activity change  and fatigue. Negative for fever.  HENT: Negative for sore throat.   Eyes: Negative for visual disturbance.  Respiratory: Negative for cough and shortness of breath.   Cardiovascular: Negative for chest pain.  Gastrointestinal: Negative for abdominal pain, constipation, diarrhea, nausea and vomiting.  Genitourinary: Negative for difficulty urinating.  Musculoskeletal: Negative for back pain and neck pain.  Skin: Negative for rash.  Neurological: Negative for seizures, syncope, weakness (generalized), light-headedness and headaches.    Physical Exam Updated Vital Signs BP 131/76 (BP Location: Right Arm)   Pulse  92   Temp 98.7 F (37.1 C) (Oral)   Resp 15   SpO2 100%   Physical Exam Vitals and nursing note reviewed.  Constitutional:      General: She is not in acute distress.    Appearance: She is well-developed. She is not diaphoretic.  HENT:     Head: Normocephalic and atraumatic.  Eyes:     Conjunctiva/sclera: Conjunctivae normal.  Cardiovascular:     Rate and Rhythm: Normal rate and regular rhythm.     Heart sounds: Normal heart sounds. No murmur heard.  No friction rub. No gallop.   Pulmonary:     Effort: Pulmonary effort is normal. No respiratory distress.     Breath sounds: Normal breath sounds. No wheezing or rales.  Abdominal:     General: There is no distension.     Palpations: Abdomen is soft.     Tenderness: There is no abdominal tenderness. There is no guarding.  Musculoskeletal:        General: No tenderness.     Cervical back: Normal range of motion.  Skin:    General: Skin is warm and dry.     Findings: No erythema or rash.  Neurological:     Mental Status: She is alert and oriented to person, place, and time.     ED Results / Procedures / Treatments   Labs (all labs ordered are listed, but only abnormal results are displayed) Labs Reviewed  CBC - Abnormal; Notable for the following components:      Result Value   Hemoglobin 16.0 (*)    All other components within normal limits  DIFFERENTIAL - Abnormal; Notable for the following components:   Neutro Abs 8.2 (*)    All other components within normal limits  COMPREHENSIVE METABOLIC PANEL - Abnormal; Notable for the following components:   Sodium 129 (*)    Potassium 3.3 (*)    Chloride 91 (*)    CO2 17 (*)    Glucose, Bld 131 (*)    AST 48 (*)    ALT 64 (*)    Total Bilirubin 2.4 (*)    Anion gap 21 (*)    All other components within normal limits  LACTIC ACID, PLASMA - Abnormal; Notable for the following components:   Lactic Acid, Venous 2.0 (*)    All other components within normal limits  CBG  MONITORING, ED - Abnormal; Notable for the following components:   Glucose-Capillary 147 (*)    All other components within normal limits  I-STAT CHEM 8, ED - Abnormal; Notable for the following components:   Sodium 129 (*)    Potassium 3.2 (*)    Chloride 92 (*)    Glucose, Bld 123 (*)    TCO2 19 (*)    Hemoglobin 16.0 (*)    HCT 47.0 (*)    All other components within normal limits  SARS CORONAVIRUS 2 BY RT PCR (HOSPITAL ORDER, PERFORMED IN  [facility])  PROTIME-INR  APTT  HIV ANTIBODY (ROUTINE TESTING W REFLEX)  TSH  MAGNESIUM  BASIC METABOLIC PANEL  CBC  URINALYSIS, ROUTINE W REFLEX MICROSCOPIC  CBG MONITORING, ED    EKG None  Radiology CT HEAD WO CONTRAST  Result Date: 05/30/2020 CLINICAL DATA:  Patient status fall. EXAM: CT HEAD WITHOUT CONTRAST TECHNIQUE: Contiguous axial images were obtained from the base of the skull through the vertex without intravenous contrast. COMPARISON:  None. FINDINGS: Brain: Ventricles and sulci are appropriate for patient's age. No evidence for acute cortically based infarct, intracranial hemorrhage, mass lesion or mass-effect. Vascular: Unremarkable Skull: Intact. Sinuses/Orbits: Paranasal sinuses are well aerated. Mastoid air cells are unremarkable. Other: None. IMPRESSION: No acute intracranial process. Electronically Signed   By: Annia Belt M.D.   On: 05/30/2020 12:00    Procedures Procedures (including critical care time)  Medications Ordered in ED Medications  sodium chloride flush (NS) 0.9 % injection 3 mL (has no administration in time range)  losartan (COZAAR) tablet 75 mg (75 mg Oral Given 05/30/20 1728)  traZODone (DESYREL) tablet 50 mg (50 mg Oral Given 05/30/20 2148)  enoxaparin (LOVENOX) injection 40 mg (40 mg Subcutaneous Given 05/30/20 1729)  acetaminophen (TYLENOL) tablet 650 mg (has no administration in time range)    Or  acetaminophen (TYLENOL) suppository 650 mg (has no administration in time range)    ondansetron (ZOFRAN) tablet 4 mg (has no administration in time range)    Or  ondansetron (ZOFRAN) injection 4 mg (has no administration in time range)  potassium chloride SA (KLOR-CON) CR tablet 40 mEq (40 mEq Oral Given 05/30/20 2147)  LORazepam (ATIVAN) tablet 1-4 mg (has no administration in time range)    Or  LORazepam (ATIVAN) injection 1-4 mg (has no administration in time range)  sodium chloride 0.9 % bolus 500 mL (0 mLs Intravenous Stopped 05/30/20 1700)  sodium chloride 0.9 % 1,000 mL with thiamine 100 mg, folic acid 1 mg, multivitamins adult 10 mL infusion ( Intravenous New Bag/Given 05/30/20 1727)  sodium chloride 0.9 % bolus 500 mL (500 mLs Intravenous New Bag/Given 05/30/20 2201)    ED Course  I have reviewed the triage vital signs and the nursing notes.  Pertinent labs & imaging results that were available during my care of the patient were reviewed by me and considered in my medical decision making (see chart for details).    MDM Rules/Calculators/A&P                          64yo female with history of hypertension and etoh abuse presents with concern for generalized weakness in the setting of recent alcoholic binge with discontinuation of etoh possible withdrawal per pt hx with poor memory of recent days with falls followed by only drinking water at home and generalized weakness.  Labs significant for anion gap metabolic acidosis and hyponatremia.  Appears euvolemic on exam, suspect hyponatremia is secondary to patient drinking water with otherwise poor nutrition, as well as recent increased alcohol use.  No sign of trauma on CT. Will admit for continued care of symptomatic hyponatremia and mild metabolic acidosis. (UA and lactate pending for possible etiologies alcoholic ketoacidosis or lactic acidosis>)   Final Clinical Impression(s) / ED Diagnoses Final diagnoses:  Altered mental status, unspecified altered mental status type  Generalized weakness  Hyponatremia   Lactic acidosis  Metabolic acidosis    Rx / DC Orders ED Discharge Orders    None  Alvira Monday, MD 05/31/20 [phone]

## 2020-05-30 NOTE — H&P (Addendum)
History and Physical    Terry Chavez EUM:[id number] DOB: 08-21-1956 DOA: 05/30/2020  PCP: Sigmund Hazel, MD   Patient coming from: Home  I have personally briefly reviewed patient's old medical records in [facility]  Chief Complaint: Weakness  HPI: Terry Chavez is a 64 y.o. female with medical history significant for alcohol use disorder but per patient had been clean for about 2 years and recently started drinking again.  Patient states that she has been binging on alcohol but then stopped and does not remember what has happened to her in the last couple of days.  Patient recently moved from [place] to the area.  She states that she has not had anything to eat or drink for 3 days.  She has been drinking a lot of water and has not been eating. She complains of feeling very weak and has had multiple falls at home. Patient states she could not find her charge and was unable to call anyone for help and so she went outside her apartment and laid down on the sidewalk where she was seen by 2 people who called the ambulance. Labs show a sodium of 129, potassium of 3.2, glucose of 123, BUN of 18 and creatinine of 0.70, hemoglobin of 16 and a white count of 9.8. CT scan of the brain without contrast did not show any acute findings Twelve-lead EKG shows sinus tachycardia with multiple PVCs   ED Course: Patient is a 64 year old female who was brought into the ER by EMS for evaluation of generalized weakness.  Patient is a poor historian and does not remember a lot of what happened.  She states that she recently moved to the area and has not had access to food and has been drinking lots of water.  She was found on the sidewalk today and EMS was called.  Patient has electrolyte abnormalities and is a high risk for developing symptoms of alcohol withdrawal.  Review of Systems: As per HPI otherwise 10 point review of systems negative.    Past Medical History:  Diagnosis  Date  . Hypertension     Past Surgical History:  Procedure Laterality Date  . APPENDECTOMY    . BREAST BIOPSY Left 2001   negative, no visible scar  . CARPAL TUNNEL RELEASE       reports that she has never smoked. She has never used smokeless tobacco. She reports current alcohol use of about 8.0 standard drinks of alcohol per week. She reports that she does not use drugs.  Allergies  Allergen Reactions  . Codeine Nausea And Vomiting    Family History  Problem Relation Age of Onset  . Breast cancer Neg Hx      Prior to Admission medications   Medication Sig Start Date End Date Taking? Authorizing Provider  FLUoxetine (PROZAC) 40 MG capsule Take 80 mg by mouth daily.    [provider]  ibuprofen (ADVIL) 600 MG tablet Take 600 mg by mouth every 6 (six) hours as needed.    [provider]  losartan (COZAAR) 50 MG tablet Take 50 mg by mouth daily. 07/15/19   [provider]  propranolol (INDERAL) 10 MG tablet Take 10 mg by mouth daily. 05/28/19   [provider]  traZODone (DESYREL) 50 MG tablet Take 50 mg by mouth daily. 07/15/19   [provider]    Physical Exam: Vitals:   05/30/20 1113 05/30/20 1238 05/30/20 1340  BP: (!) 114/94 127/78 130/90  Pulse: (!) 105 95 (!)  105  Resp: 16 15 16   Temp: 99.9 F (37.7 C) 99 F (37.2 C) 98.9 F (37.2 C)  TempSrc: Oral Oral Oral  SpO2: 100% 99% 100%     Vitals:   05/30/20 1113 05/30/20 1238 05/30/20 1340  BP: (!) 114/94 127/78 130/90  Pulse: (!) 105 95 (!) 105  Resp: 16 15 16   Temp: 99.9 F (37.7 C) 99 F (37.2 C) 98.9 F (37.2 C)  TempSrc: Oral Oral Oral  SpO2: 100% 99% 100%    Constitutional: NAD, alert and oriented x 3 Eyes: PERRL, lids and conjunctivae normal, ecchymoses over left eye ENMT: Mucous membranes are moist.  Neck: normal, supple, no masses, no thyromegaly Respiratory: clear to auscultation bilaterally, no wheezing, no crackles. Normal respiratory effort. No  accessory muscle use.  Cardiovascular: Regular rate and rhythm, no murmurs / rubs / gallops. No extremity edema. 2+ pedal pulses. No carotid bruits.  Abdomen: no tenderness, no masses palpated. No hepatosplenomegaly. Bowel sounds positive.  Musculoskeletal: no clubbing / cyanosis. No joint deformity upper and lower extremities.  Skin: no rashes, lesions, ulcers.  Neurologic: Weakness Psychiatric: Normal mood and affect.   Labs on Admission: I have personally reviewed following labs and imaging studies  CBC: Recent Labs  Lab 05/30/20 1116 05/30/20 1223  WBC 9.8  --   NEUTROABS 8.2*  --   HGB 16.0* 16.0*  HCT 44.7 47.0*  MCV 89.4  --   PLT 223  --    Basic Metabolic Panel: Recent Labs  Lab 05/30/20 1116 05/30/20 1223  NA 129* 129*  K 3.3* 3.2*  CL 91* 92*  CO2 17*  --   GLUCOSE 131* 123*  BUN 17 18  CREATININE 0.94 0.70  CALCIUM 9.9  --    GFR: CrCl cannot be calculated (Unknown ideal weight.). Liver Function Tests: Recent Labs  Lab 05/30/20 1116  AST 48*  ALT 64*  ALKPHOS 62  BILITOT 2.4*  PROT 7.6  ALBUMIN 4.9   No results for input(s): LIPASE, AMYLASE in the last 168 hours. No results for input(s): AMMONIA in the last 168 hours. Coagulation Profile: Recent Labs  Lab 05/30/20 1116  INR 1.0   Cardiac Enzymes: No results for input(s): CKTOTAL, CKMB, CKMBINDEX, TROPONINI in the last 168 hours. BNP (last 3 results) No results for input(s): PROBNP in the last 8760 hours. HbA1C: No results for input(s): HGBA1C in the last 72 hours. CBG: Recent Labs  Lab 05/30/20 1112  GLUCAP 147*   Lipid Profile: No results for input(s): CHOL, HDL, LDLCALC, TRIG, CHOLHDL, LDLDIRECT in the last 72 hours. Thyroid Function Tests: No results for input(s): TSH, T4TOTAL, FREET4, T3FREE, THYROIDAB in the last 72 hours. Anemia Panel: No results for input(s): VITAMINB12, FOLATE, FERRITIN, TIBC, IRON, RETICCTPCT in the last 72 hours. Urine analysis: No results found for:  COLORURINE, APPEARANCEUR, LABSPEC, PHURINE, GLUCOSEU, HGBUR, BILIRUBINUR, KETONESUR, PROTEINUR, UROBILINOGEN, NITRITE, LEUKOCYTESUR  Radiological Exams on Admission: CT HEAD WO CONTRAST  Result Date: 05/30/2020 CLINICAL DATA:  Patient status fall. EXAM: CT HEAD WITHOUT CONTRAST TECHNIQUE: Contiguous axial images were obtained from the base of the skull through the vertex without intravenous contrast. COMPARISON:  None. FINDINGS: Brain: Ventricles and sulci are appropriate for patient's age. No evidence for acute cortically based infarct, intracranial hemorrhage, mass lesion or mass-effect. Vascular: Unremarkable Skull: Intact. Sinuses/Orbits: Paranasal sinuses are well aerated. Mastoid air cells are unremarkable. Other: None. IMPRESSION: No acute intracranial process. Electronically Signed   By: 06/01/20 M.D.   On: 05/30/2020 12:00  EKG: Independently reviewed.  Sinus tachycardia with PVCs  Assessment/Plan Principal Problem:   Hyponatremia Active Problems:   Hypokalemia   Depression   Generalized weakness     Hyponatremia Most likely multifactorial and related to poor solute intake, excess water ingestion as well as medication induced Patient was on NSAIDS and SSRI which are currently on hold Will place patient on a banana bag @ 100cc/hr Repeat sodium levels in am   Hypokalemia Supplement potassium Check magnesium levels   Generalized weakness Status post multiple falls at home Most likely secondary to hyponatremia Will place patient on fall precautions PT evaluate and treat   History of alcohol use disorder  Patient states that she has been clean for about 2 years but due to recent stressors (divorce) she started drinking again. Will place patient on Lorazepam and administer for CIWA score of 8 or greater Place patient on a Banana bag   Depression Hold SSRI for now    DVT prophylaxis: Lovenox Code Status: Full code Family Communication: Greater than 50% of  time was spent discussing plan of care with patient at the bedside.  All questions and concerns have been addressed.  She verbalizes understanding and agrees with the plan. Disposition Plan: Back to previous home environment Consults called: Physical Therapy    Betina Puckett MD [facility]     05/30/2020, 4:24 PM

## 2020-05-30 NOTE — ED Triage Notes (Signed)
EMS stated, she was lying on the side walk trying to get help. Someone called just passing by. pt unaware when she fell. She has trauma to her face, no memory of when she moved,  the movers, no food, just drinking water for the last week. Stroke screen neg. She has a lot of stress, and going through a divorce???? Says she moved form [place].

## 2020-05-30 NOTE — ED Notes (Signed)
Attempted report x1. 

## 2020-05-31 ENCOUNTER — Other Ambulatory Visit: Payer: Self-pay

## 2020-05-31 ENCOUNTER — Encounter (HOSPITAL_COMMUNITY): Payer: Self-pay | Admitting: Internal Medicine

## 2020-05-31 LAB — BASIC METABOLIC PANEL
Anion gap: 12 (ref 5–15)
BUN: 20 mg/dL (ref 8–23)
CO2: 22 mmol/L (ref 22–32)
Calcium: 9.2 mg/dL (ref 8.9–10.3)
Chloride: 100 mmol/L (ref 98–111)
Creatinine, Ser: 0.98 mg/dL (ref 0.44–1.00)
GFR calc Af Amer: >60 mL/min (ref >60)
GFR calc non Af Amer: >60 mL/min (ref >60)
Glucose, Bld: 105 mg/dL — ABNORMAL HIGH (ref 70–99)
Potassium: 3.4 mmol/L — ABNORMAL LOW (ref 3.5–5.1)
Sodium: 134 mmol/L — ABNORMAL LOW (ref 135–145)

## 2020-05-31 LAB — CBC
HCT: 38.8 % (ref 36.0–46.0)
Hemoglobin: 13.6 g/dL (ref 12.0–15.0)
MCH: 31.2 pg (ref 26.0–34.0)
MCHC: 35.1 g/dL (ref 30.0–36.0)
MCV: 89 fL (ref 80.0–100.0)
Platelets: 150 10*3/uL (ref 150–400)
RBC: 4.36 MIL/uL (ref 3.87–5.11)
RDW: 15.3 % (ref 11.5–15.5)
WBC: 8.4 10*3/uL (ref 4.0–10.5)
nRBC: 0 % (ref 0.0–0.2)

## 2020-05-31 MED ORDER — POTASSIUM CHLORIDE 20 MEQ PO PACK
40.0000 meq | PACK | Freq: Once | ORAL | Status: AC
  Administered 2020-05-31: 40 meq via ORAL
  Filled 2020-05-31: qty 2

## 2020-05-31 NOTE — Evaluation (Signed)
Physical Therapy Evaluation Patient Details Name: Terry Chavez MRN: [id number] DOB: Sep 13, 1956 Today's Date: 05/31/2020   History of Present Illness  The pt is a 64 yo female presenting for evaluation of generalized weakness and hyponatremia after being found by passerby on the sidewalk. The pt reports she has been drinking water and alcohol only for 3 days and has sustained multiple falls at home. PMH includes: acohol use (clean for 2 years until reccently).  Clinical Impression  Pt in bed upon arrival of PT, agreeable to evaluation at this time. Prior to admission the pt was completely independent without use of an AD, and in the process of moving into a new house. The pt now presents with limitations in functional mobility, endurance, activity tolerance, and dynamic stability due to above dx, and will continue to benefit from skilled PT to address these deficits. The pt was able to demo good hallway ambulation without assist, and 2 x 3 steps with use of 1 rail with minG only, but her HR increased to 140 bpm with this activity, and the pt reports significant fatigue. The pt will continue to benefit from skilled PT to further progress mobility and safety with independence to facilitate return to the pt's level of independence PTA.      Follow Up Recommendations Home health PT (vs no follow up with progress)    Equipment Recommendations  None recommended by PT    Recommendations for Other Services       Precautions / Restrictions Precautions Precautions: Fall Precaution Comments: Watch HR Restrictions Weight Bearing Restrictions: No      Mobility  Bed Mobility Overal bed mobility: Independent                Transfers Overall transfer level: Needs assistance Equipment used: None Transfers: Sit to/from [employer] Transfers Sit to Stand: Supervision Stand pivot transfers: Supervision       General transfer comment: supervision for safety and cues for  line management  Ambulation/Gait Ambulation/Gait assistance: Min guard Gait Distance (Feet): 300 Feet Assistive device: None Gait Pattern/deviations: Step-through pattern Gait velocity: 0.7 m/s Gait velocity interpretation: 1.31 - 2.62 ft/sec, indicative of limited community ambulator General Gait Details: pt with good stability and speed, no use of AD. HR to 140s with prolonged ambulation  Stairs Stairs: Yes Stairs assistance: Min guard Stair Management: One rail Right;Alternating pattern;Forwards Number of Stairs: 3 (x 2) General stair comments: Pt completed 3 steps x2 (limited by IV lines), reports SOB and increased fatigue following second set of steps. HR in 140s      Balance Overall balance assessment: Mild deficits observed, not formally tested                                           Pertinent Vitals/Pain Pain Assessment: No/denies pain    Home Living Family/patient expects to be discharged to:: Private residence Living Arrangements: Alone Available Help at Discharge: Other (Comment);Available PRN/intermittently (AA sponsor, PRN) Type of Home: House Home Access: Stairs to enter Entrance Stairs-Rails: Right Entrance Stairs-Number of Steps: 12 at front with rail on R side Home Layout: Multi-level Home Equipment: Cane - single point Additional Comments: is in the middle of moving and is not yet unpacked. Only knows her AA sponsor in the area.    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   Dominant Hand: Right    Extremity/Trunk Assessment   Upper Extremity Assessment Upper Extremity Assessment: Overall WFL for tasks assessed    Lower Extremity Assessment Lower Extremity Assessment: Overall WFL for tasks assessed    Cervical / Trunk Assessment Cervical / Trunk Assessment: Normal  Communication   Communication: No difficulties  Cognition Arousal/Alertness:  Awake/alert Behavior During Therapy: WFL for tasks assessed/performed Overall Cognitive Status: Within Functional Limits for tasks assessed                                 General Comments: slightly reduced insight to safety (benefits from cues for line management), but was able to give detailed history with good accuracy, all conversation within functional expectations      General Comments General comments (skin integrity, edema, etc.): Pt with elevated HR that returned to 100s with standing or seated rest. Pt reports no discomfort.    Exercises     Assessment/Plan    PT Assessment Patient needs continued PT services  PT Problem List Decreased mobility;Decreased safety awareness;Decreased coordination;Decreased activity tolerance;Decreased balance       PT Treatment Interventions Therapeutic exercise;Gait training;Balance training;Stair training;Functional mobility training;Patient/family education;Therapeutic activities    PT Goals (Current goals can be found in the Care Plan section)  Acute Rehab PT Goals Patient Stated Goal: return home PT Goal Formulation: With patient Time For Goal Achievement: 06/14/20 Potential to Achieve Goals: Good    Frequency Min 4X/week   Barriers to discharge Decreased caregiver support pt lives home alone, is in the process of moving into her house       AM-PAC PT "6 Clicks" Mobility  Outcome Measure Help needed turning from your back to your side while in a flat bed without using bedrails?: None Help needed moving from lying on your back to sitting on the side of a flat bed without using bedrails?: None Help needed moving to and from a bed to a chair (including a wheelchair)?: A Little Help needed standing up from a chair using your arms (e.g., wheelchair or bedside chair)?: None Help needed to walk in hospital room?: A Little Help needed climbing 3-5 steps with a railing? : A Little 6 Click Score: 21    End of Session  Equipment Utilized During Treatment: Gait belt Activity Tolerance: Patient tolerated treatment well;Patient limited by fatigue Patient left: in chair;with call bell/phone within reach Nurse Communication: Mobility status (HR elevation) PT Visit Diagnosis: Difficulty in walking, not elsewhere classified (R26.2)    Time: 8127-5170 PT Time Calculation (min) (ACUTE ONLY): 34 min   Charges:   PT Evaluation $PT Eval Moderate Complexity: 1 Mod PT Treatments $Gait Training: 8-22 mins        Rolm Baptise, PT, DPT   Acute Rehabilitation Department Pager #: [phone]  Gaetana Michaelis 05/31/2020, 1:09 PM

## 2020-05-31 NOTE — Plan of Care (Signed)

## 2020-05-31 NOTE — Progress Notes (Signed)
PROGRESS NOTE    Terry Chavez  OZD:[id number] DOB: [birth year] DOA: 05/30/2020 PCP: Sigmund Hazel, MD   Brief Narrative:   Terry Chavez is a 64 y.o. female with medical history significant for alcohol use disorder but per patient had been clean for about 2 years and recently started drinking again.  Patient states that she has been binging on alcohol but then stopped and does not remember what has happened to her in the last couple of days.  Patient recently moved from [place] to this area.  She states that she has not had anything to eat or drink for 3 days.  She has been drinking a lot of water and has not been eating. She complains of feeling very weak and has had multiple falls at home. Patient states she could not find her charger and was unable to call anyone for help and so she went outside her apartment and laid down on the sidewalk where she was seen by 2 people who called the ambulance. Labs show a sodium of 129, potassium of 3.2, glucose of 123, BUN of 18 and creatinine of 0.70, hemoglobin of 16 and a white count of 9.8. CT scan of the brain without contrast did not show any acute findings Twelve-lead EKG shows sinus tachycardia with multiple PVCs Patient is admitted for generalized weakness found to have hyponatremia hypokalemia and other electrolyte abnormalities.   Assessment & Plan:   Principal Problem:   Hyponatremia Active Problems:   Hypokalemia   Depression   Generalized weakness   Hyponatremia >>> improving Most likely multifactorial and related to poor solute intake, excess water ingestion as well as medication induced. Patient was on NSAIDS and SSRI which are currently on hold Will place patient on a banana bag @ 100cc/hr Repeat sodium levels in am 134, improving  Hypokalemia >>> improving Supplement potassium Check magnesium levels   Generalized weakness Status post multiple falls at home Most likely secondary to  hyponatremia Will place patient on fall precautions PT evaluate and treat   History of alcohol use disorder  Patient states that she has been clean for about 2 years but due to recent stressors (divorce) she started drinking again. Will place patient on Lorazepam and administer for CIWA score of 8 or greater Place patient on a Banana bag   Depression Hold SSRI for now   DVT prophylaxis: Lovenox Code Status: Full code Family Communication: Discussed with patient in detail no family around. Disposition Plan: Back to previous home environment.  Anticipated discharge in 1 to 2 days. Consults called: Physical Therapy  Consultants:    None.  Procedures: Antimicrobials: Anti-infectives (From admission, onward)   None     Subjective: Patient was seen and examined at bedside.  No overnight events.  Patient reports feeling much better,  patient was having lunch when seen.  Objective: Vitals:   05/30/20 1700 05/30/20 1730 05/30/20 1929 05/31/20 0459  BP: (!) 145/81 (!) 153/95 131/76 127/77  Pulse: 89 (!) 119 92 81  Resp: 14 (!) 22 15 15   Temp:   98.7 F (37.1 C) 98.9 F (37.2 C)  TempSrc:   Oral Oral  SpO2: 100% 99% 100% 100%   No intake or output data in the 24 hours ending 05/31/20 0837 There were no vitals filed for this visit.  Examination:  General exam: Appears calm and comfortable  Respiratory system: Clear to auscultation. Respiratory effort normal. Cardiovascular system: S1 & S2 heard, RRR. No JVD, murmurs, rubs, gallops or clicks. No pedal edema.  Gastrointestinal system: Abdomen is nondistended, soft and nontender. No organomegaly or masses felt. Normal bowel sounds heard. Central nervous system: Alert and oriented. No focal neurological deficits. Extremities: Symmetric 5 x 5 power. Skin: No rashes, lesions or ulcers Psychiatry: Judgement and insight appear normal. Mood & affect appropriate.     Data Reviewed: I have personally reviewed following  labs and imaging studies  CBC: Recent Labs  Lab 05/30/20 1116 05/30/20 1223 05/31/20 0106  WBC 9.8  --  8.4  NEUTROABS 8.2*  --   --   HGB 16.0* 16.0* 13.6  HCT 44.7 47.0* 38.8  MCV 89.4  --  89.0  PLT 223  --  150   Basic Metabolic Panel: Recent Labs  Lab 05/30/20 1116 05/30/20 1223 05/30/20 1953 05/31/20 0106  NA 129* 129*  --  134*  K 3.3* 3.2*  --  3.4*  CL 91* 92*  --  100  CO2 17*  --   --  22  GLUCOSE 131* 123*  --  105*  BUN 17 18  --  20  CREATININE 0.94 0.70  --  0.98  CALCIUM 9.9  --   --  9.2  MG  --   --  2.1  --    GFR: CrCl cannot be calculated (Unknown ideal weight.). Liver Function Tests: Recent Labs  Lab 05/30/20 1116  AST 48*  ALT 64*  ALKPHOS 62  BILITOT 2.4*  PROT 7.6  ALBUMIN 4.9   No results for input(s): LIPASE, AMYLASE in the last 168 hours. No results for input(s): AMMONIA in the last 168 hours. Coagulation Profile: Recent Labs  Lab 05/30/20 1116  INR 1.0   Cardiac Enzymes: No results for input(s): CKTOTAL, CKMB, CKMBINDEX, TROPONINI in the last 168 hours. BNP (last 3 results) No results for input(s): PROBNP in the last 8760 hours. HbA1C: No results for input(s): HGBA1C in the last 72 hours. CBG: Recent Labs  Lab 05/30/20 1112  GLUCAP 147*   Lipid Profile: No results for input(s): CHOL, HDL, LDLCALC, TRIG, CHOLHDL, LDLDIRECT in the last 72 hours. Thyroid Function Tests: Recent Labs    05/30/20 1953  TSH 2.544   Anemia Panel: No results for input(s): VITAMINB12, FOLATE, FERRITIN, TIBC, IRON, RETICCTPCT in the last 72 hours. Sepsis Labs: Recent Labs  Lab 05/30/20 1953  LATICACIDVEN 2.0*    Recent Results (from the past 240 hour(s))  SARS Coronavirus 2 by RT PCR (hospital order, performed in [facility] hospital lab) Nasopharyngeal Nasopharyngeal Swab     Status: None   Collection Time: 05/30/20  3:47 PM   Specimen: Nasopharyngeal Swab  Result Value Ref Range Status   SARS Coronavirus 2 NEGATIVE NEGATIVE  Final    Comment: (NOTE) SARS-CoV-2 target nucleic acids are NOT DETECTED.  The SARS-CoV-2 RNA is generally detectable in upper and lower respiratory specimens during the acute phase of infection. The lowest concentration of SARS-CoV-2 viral copies this assay can detect is 250 copies / mL. A negative result does not preclude SARS-CoV-2 infection and should not be used as the sole basis for treatment or other patient management decisions.  A negative result may occur with improper specimen collection / handling, submission of specimen other than nasopharyngeal swab, presence of viral mutation(s) within the areas targeted by this assay, and inadequate number of viral copies (<250 copies / mL). A negative result must be combined with clinical observations, patient history, and epidemiological information.  Fact Sheet for Patients:   BoilerBrush.com.cy  Fact Sheet for Healthcare Providers:  https://pope.com/  This test is not yet approved or  cleared by the [place] and has been authorized for detection and/or diagnosis of SARS-CoV-2 by FDA under an Emergency Use Authorization (EUA).  This EUA will remain in effect (meaning this test can be used) for the duration of the COVID-19 declaration under Section 564(b)(1) of the Act, 21 U.S.C. section 360bbb-3(b)(1), unless the authorization is terminated or revoked sooner.  Performed at [facility], [address] [place], [place] [address]     Radiology Studies: CT HEAD WO CONTRAST  Result Date: 05/30/2020 CLINICAL DATA:  Patient status fall. EXAM: CT HEAD WITHOUT CONTRAST TECHNIQUE: Contiguous axial images were obtained from the base of the skull through the vertex without intravenous contrast. COMPARISON:  None. FINDINGS: Brain: Ventricles and sulci are appropriate for patient's age. No evidence for acute cortically based infarct, intracranial hemorrhage, mass lesion or  mass-effect. Vascular: Unremarkable Skull: Intact. Sinuses/Orbits: Paranasal sinuses are well aerated. Mastoid air cells are unremarkable. Other: None. IMPRESSION: No acute intracranial process. Electronically Signed   By: Annia Belt M.D.   On: 05/30/2020 12:00    Scheduled Meds: . enoxaparin (LOVENOX) injection  40 mg Subcutaneous Q24H  . losartan  75 mg Oral Daily  . potassium chloride  40 mEq Oral BID  . sodium chloride flush  3 mL Intravenous Once   Continuous Infusions:   LOS: 1 day    Time spent: 25 mins.    Cipriano Bunker, MD [facility]   If 7PM-7AM, please contact night-coverage

## 2020-06-01 LAB — BASIC METABOLIC PANEL
Anion gap: 10 (ref 5–15)
BUN: 16 mg/dL (ref 8–23)
CO2: 23 mmol/L (ref 22–32)
Calcium: 9.1 mg/dL (ref 8.9–10.3)
Chloride: 103 mmol/L (ref 98–111)
Creatinine, Ser: 0.75 mg/dL (ref 0.44–1.00)
GFR calc Af Amer: >60 mL/min (ref >60)
GFR calc non Af Amer: >60 mL/min (ref >60)
Glucose, Bld: 104 mg/dL — ABNORMAL HIGH (ref 70–99)
Potassium: 3.5 mmol/L (ref 3.5–5.1)
Sodium: 136 mmol/L (ref 135–145)

## 2020-06-01 MED ORDER — LOPERAMIDE HCL 2 MG PO CAPS
2.0000 mg | ORAL_CAPSULE | Freq: Once | ORAL | Status: AC
  Administered 2020-06-01: 2 mg via ORAL
  Filled 2020-06-01: qty 1

## 2020-06-01 NOTE — Discharge Instructions (Signed)
Advised to follow up PCP in one week. Advised to follow up physical therapy.

## 2020-06-01 NOTE — Progress Notes (Signed)
Pt AVS reviewed, IV removed, dressed, and ride called. Pt opted to use the third party services to get home. Waiver is in the chart. All questions answered to satisfaction, will continue to monitor until d/c.

## 2020-06-01 NOTE — Progress Notes (Addendum)
Physical Therapy Treatment Patient Details Name: Terry Chavez MRN: [id number] DOB: June 23, 1956 Today's Date: 06/01/2020    History of Present Illness The pt is a 64 yo female presenting for evaluation of generalized weakness and hyponatremia after being found by passerby on the sidewalk. The pt reports she has been drinking water and alcohol only for 3 days and has sustained multiple falls at home. PMH includes: acohol use (clean for 2 years until reccently).    PT Comments    Pt supine in bed on arrival.  Pt pleasant and motivated to move this am.  Focused on gt training and stair training.  Pt is performing mobility well.  Her endurance needs work but this should improve as she return home and moves around more.  Continue to recommend HHPT for home safety eval.    Follow Up Recommendations  Home health PT     Equipment Recommendations  None recommended by PT    Recommendations for Other Services       Precautions / Restrictions Precautions Precautions: Fall Precaution Comments: Watch HR Restrictions Weight Bearing Restrictions: No    Mobility  Bed Mobility Overal bed mobility: Independent                Transfers Overall transfer level: Independent Equipment used: None   Sit to Stand: Supervision            Ambulation/Gait Ambulation/Gait assistance: Supervision Gait Distance (Feet): 450 Feet Assistive device: None Gait Pattern/deviations: Step-through pattern;Drifts right/left     General Gait Details: Mild drift but no overt LOB with good righting responses.  Pt required cues for reciprocal arm swing.  Reports mild dizziness after stair training.   Stairs Stairs: Yes Stairs assistance: Supervision Stair Management: One rail Right;Alternating pattern;Forwards;Two rails Number of Stairs: 20 General stair comments: Pt performed multiple bouts of stair trainer. No LOB and reports mild dizziness after stair training but able to walk two laps  in halls where symptoms did not worsed.   Wheelchair Mobility    Modified Rankin (Stroke Patients Only)       Balance Overall balance assessment: Mild deficits observed, not formally tested                                          Cognition Arousal/Alertness: Awake/alert Behavior During Therapy: WFL for tasks assessed/performed;Anxious Overall Cognitive Status: Within Functional Limits for tasks assessed                                 General Comments: Pt very anxious about returning home and need to unpack her boxes as she has just moved in.      Exercises      General Comments        Pertinent Vitals/Pain Pain Assessment: No/denies pain    Home Living                      Prior Function            PT Goals (current goals can now be found in the care plan section) Acute Rehab PT Goals Patient Stated Goal: return home Potential to Achieve Goals: Good Progress towards PT goals: Progressing toward goals    Frequency    Min 4X/week      PT Plan Current plan remains appropriate  Co-evaluation              AM-PAC PT "6 Clicks" Mobility   Outcome Measure  Help needed turning from your back to your side while in a flat bed without using bedrails?: None Help needed moving from lying on your back to sitting on the side of a flat bed without using bedrails?: None Help needed moving to and from a bed to a chair (including a wheelchair)?: None Help needed standing up from a chair using your arms (e.g., wheelchair or bedside chair)?: None Help needed to walk in hospital room?: None Help needed climbing 3-5 steps with a railing? : None 6 Click Score: 24    End of Session Equipment Utilized During Treatment: Gait belt Activity Tolerance: Patient tolerated treatment well;Patient limited by fatigue Patient left: in bed Nurse Communication: Mobility status PT Visit Diagnosis: Difficulty in walking, not elsewhere  classified (R26.2)     Time: 0626-9485 PT Time Calculation (min) (ACUTE ONLY): 12 min  Charges:  $Gait Training: 8-22 mins                     Bonney Leitz , PTA Acute Rehabilitation Services Pager [phone] Office [phone]     Tinita Brooker Artis Delay 06/01/2020, 12:43 PM

## 2020-06-01 NOTE — Plan of Care (Signed)

## 2020-06-01 NOTE — Discharge Summary (Signed)
Physician Discharge Summary  Terry Chavez YHC:[id number] DOB: 1955-11-03 DOA: 05/30/2020  PCP: Sigmund Hazel, MD  Admit date: 05/30/2020   Discharge date: 06/01/2020  Admitted From: Home Disposition: Home with Home services.  Recommendations for Outpatient Follow-up:  1. Follow up with PCP in 1-2 weeks 2. Please obtain BMP/CBC in one week. 3. Please Follow up Cardiology for PVC on the EKG.   Home Health: Yes  Home PT Equipment/Devices: None  Discharge Condition: Stable CODE STATUS:Full code Diet recommendation: Heart Healthy  Brief Summary: Terry Chavez a 64 y.o.femalewith medical history significant foralcoholuse disorderbut per patient had been clean for about 2 years and recently started drinking again. Patient states that she has been binging on alcohol but then stopped and does not remember what has happened to herin the last couple of days. Patient recently moved from [place] to this area.She states that she has not had anything to eat or drink for 3 days. She has been drinking a lot of water and has not been eating. She complains of feeling very weak and has had multiple falls at home. Patient states she could not find her charger and was unable to call anyone for help and so she went outside her apartment and laid down on the sidewalk where she was seen by 2 people who called the ambulance. Labs show a sodium of 129, potassium of 3.2, glucose of 123, BUN of 18 and creatinine of 0.70, hemoglobin of 16 and a white count of 9.8. CT scan of the brain without contrast did not show any acute findings Twelve-lead EKG shows sinus tachycardia with multiple PVCs.  Hospital course: Patient was admitted for generalized weakness, found to have hyponatremia, hypokalemia and other electrolyte abnormalities.  She was managed with IV fluids,  electrolyte replacements were completed.  she feels much better, she has participated in physical therapy,   recommended outpatient physical therapy.  All electrolytes and kidney functions back to normal.  She feels better and wants to be discharged.  Her EKG shows sinus tachycardia with PVCs, She denies any chest pain, shortness of breath, dizziness, palpitations.  Advised to follow-up with primary care physician and possible cardiology for further evaluation. Patient was provided resources to quit alcohol..  Case management has arranged home physical therapy.   She was managed for below problems.  Discharge Diagnoses:  Principal Problem:   Hyponatremia Active Problems:   Hypokalemia   Depression   Generalized weakness  Hyponatremia >>> Resolved. Most likely multifactorial and related to poor solute intake, excess water ingestion as well as medication induced. Patient was on NSAIDS and SSRI which are currently on hold. Will place patient on a banana bag @ 100cc/hr Repeat sodium levels in am 138  Hypokalemia >>> Resolved Supplement potassium Check magnesium levels   Generalized weakness - Improved. Status post multiple falls at home. Most likely secondary to hyponatremia Will place patient on fall precautions PT evaluate and treat : [facility] Home PT   History ofalcohol use disorder Patient states that she has been clean for about 2 years but due to recent stressors (divorce) she started drinking again. Will place patient on Lorazepam and administer for CIWA score of 8 or greater Place patient on a Banana bag   Depression Hold SSRI for now   Discharge Instructions  Discharge Instructions    Call MD for:  extreme fatigue   Complete by: As directed    Call MD for:  persistant dizziness or light-headedness   Complete by: As directed  Call MD for:  persistant nausea and vomiting   Complete by: As directed    Diet - low sodium heart healthy   Complete by: As directed    Diet Carb Modified   Complete by: As directed    Discharge instructions   Complete by: As  directed    Advised to follow up PCP in one week. Advised to follow up physical therapy.   Increase activity slowly   Complete by: As directed      Allergies as of 06/01/2020      Reactions   Codeine Nausea And Vomiting      Medication List    TAKE these medications   busPIRone 15 MG tablet Commonly known as: BUSPAR Take 15 mg by mouth See admin instructions. Take one tablet (15 mg) by mouth every morning, may also take one tablet (15 mg) at night as needed for anxiety   FLUoxetine 40 MG capsule Commonly known as: PROZAC Take 80 mg by mouth daily.   losartan 50 MG tablet Commonly known as: COZAAR Take 50 mg by mouth daily.   multivitamin with minerals Tabs tablet Take 1 tablet by mouth daily.   naphazoline-pheniramine 0.025-0.3 % ophthalmic solution Commonly known as: NAPHCON-A Place 1 drop into both eyes 4 (four) times daily as needed for eye irritation.   naproxen sodium 220 MG tablet Commonly known as: ALEVE Take 440 mg by mouth daily as needed (pain/headache).   propranolol 10 MG tablet Commonly known as: INDERAL Take 10 mg by mouth at bedtime as needed (panic attacks).   traZODone 50 MG tablet Commonly known as: DESYREL Take 50-100 mg by mouth at bedtime.       Follow-up Information    Dr. Sigmund HazelLisa Miller Follow up.   Contact information: [address],  [place], [place] [address] Phone: [phone]       Sigmund HazelMiller, Lisa, MD Follow up in 1 week(s).   Specialty: Family Medicine Contact information: [address] [place] [address] [phone]              Allergies  Allergen Reactions  . Codeine Nausea And Vomiting    Consultations:  Physical Therapy   Procedures/Studies: CT HEAD WO CONTRAST  Result Date: 05/30/2020 CLINICAL DATA:  Patient status fall. EXAM: CT HEAD WITHOUT CONTRAST TECHNIQUE: Contiguous axial images were obtained from the base of the skull through the vertex without intravenous contrast. COMPARISON:  None.  FINDINGS: Brain: Ventricles and sulci are appropriate for patient's age. No evidence for acute cortically based infarct, intracranial hemorrhage, mass lesion or mass-effect. Vascular: Unremarkable Skull: Intact. Sinuses/Orbits: Paranasal sinuses are well aerated. Mastoid air cells are unremarkable. Other: None. IMPRESSION: No acute intracranial process. Electronically Signed   By: Annia Beltrew  Davis M.D.   On: 05/30/2020 12:00     Subjective: Patient was seen and examined at bedside.  No overnight events.  Patient was seen when she was having breakfast.  She reports feeling better and wants to be discharged.   Discharge Exam: Vitals:   06/01/20 0317 06/01/20 0743  BP: (!) 145/87 (!) 163/91  Pulse: 80 78  Resp: 16 17  Temp: 98.4 F (36.9 C) 98.2 F (36.8 C)  SpO2: 98% 100%   Vitals:   05/31/20 1616 05/31/20 1956 06/01/20 0317 06/01/20 0743  BP: (!) 150/92 133/86 (!) 145/87 (!) 163/91  Pulse: 88 88 80 78  Resp: 18 16 16 17   Temp:  98.8 F (37.1 C) 98.4 F (36.9 C) 98.2 F (36.8 C)  TempSrc:  Oral Oral Oral  SpO2: 98% 99% 98% 100%  Weight:      Height:        General: Pt is alert, awake, not in acute distress Cardiovascular: RRR, S1/S2 +, no rubs, no gallops Respiratory: CTA bilaterally, no wheezing, no rhonchi Abdominal: Soft, NT, ND, bowel sounds + Extremities: no edema, no cyanosis    The results of significant diagnostics from this hospitalization (including imaging, microbiology, ancillary and laboratory) are listed below for reference.     Microbiology: Recent Results (from the past 240 hour(s))  SARS Coronavirus 2 by RT PCR (hospital order, performed in [facility] hospital lab) Nasopharyngeal Nasopharyngeal Swab     Status: None   Collection Time: 05/30/20  3:47 PM   Specimen: Nasopharyngeal Swab  Result Value Ref Range Status   SARS Coronavirus 2 NEGATIVE NEGATIVE Final    Comment: (NOTE) SARS-CoV-2 target nucleic acids are NOT DETECTED.  The SARS-CoV-2 RNA is  generally detectable in upper and lower respiratory specimens during the acute phase of infection. The lowest concentration of SARS-CoV-2 viral copies this assay can detect is 250 copies / mL. A negative result does not preclude SARS-CoV-2 infection and should not be used as the sole basis for treatment or other patient management decisions.  A negative result may occur with improper specimen collection / handling, submission of specimen other than nasopharyngeal swab, presence of viral mutation(s) within the areas targeted by this assay, and inadequate number of viral copies (<250 copies / mL). A negative result must be combined with clinical observations, patient history, and epidemiological information.  Fact Sheet for Patients:   BoilerBrush.com.cy  Fact Sheet for Healthcare Providers: https://pope.com/  This test is not yet approved or  cleared by the [place] FDA and has been authorized for detection and/or diagnosis of SARS-CoV-2 by FDA under an Emergency Use Authorization (EUA).  This EUA will remain in effect (meaning this test can be used) for the duration of the COVID-19 declaration under Section 564(b)(1) of the Act, 21 U.S.C. section 360bbb-3(b)(1), unless the authorization is terminated or revoked sooner.  Performed at [facility], [address] [place], [place] [address]      Labs: BNP (last 3 results) No results for input(s): BNP in the last 8760 hours. Basic Metabolic Panel: Recent Labs  Lab 05/30/20 1116 05/30/20 1223 05/30/20 1953 05/31/20 0106 06/01/20 0240  NA 129* 129*  --  134* 136  K 3.3* 3.2*  --  3.4* 3.5  CL 91* 92*  --  100 103  CO2 17*  --   --  22 23  GLUCOSE 131* 123*  --  105* 104*  BUN 17 18  --  20 16  CREATININE 0.94 0.70  --  0.98 0.75  CALCIUM 9.9  --   --  9.2 9.1  MG  --   --  2.1  --   --    Liver Function Tests: Recent Labs  Lab 05/30/20 1116  AST 48*  ALT 64*   ALKPHOS 62  BILITOT 2.4*  PROT 7.6  ALBUMIN 4.9   No results for input(s): LIPASE, AMYLASE in the last 168 hours. No results for input(s): AMMONIA in the last 168 hours. CBC: Recent Labs  Lab 05/30/20 1116 05/30/20 1223 05/31/20 0106  WBC 9.8  --  8.4  NEUTROABS 8.2*  --   --   HGB 16.0* 16.0* 13.6  HCT 44.7 47.0* 38.8  MCV 89.4  --  89.0  PLT  223  --  150   Cardiac Enzymes: No results for input(s): CKTOTAL, CKMB, CKMBINDEX, TROPONINI in the last 168 hours. BNP: Invalid input(s): POCBNP CBG: Recent Labs  Lab 05/30/20 1112  GLUCAP 147*   D-Dimer No results for input(s): DDIMER in the last 72 hours. Hgb A1c No results for input(s): HGBA1C in the last 72 hours. Lipid Profile No results for input(s): CHOL, HDL, LDLCALC, TRIG, CHOLHDL, LDLDIRECT in the last 72 hours. Thyroid function studies Recent Labs    05/30/20 1953  TSH 2.544   Anemia work up No results for input(s): VITAMINB12, FOLATE, FERRITIN, TIBC, IRON, RETICCTPCT in the last 72 hours. Urinalysis No results found for: COLORURINE, APPEARANCEUR, LABSPEC, PHURINE, GLUCOSEU, HGBUR, BILIRUBINUR, KETONESUR, PROTEINUR, UROBILINOGEN, NITRITE, LEUKOCYTESUR Sepsis Labs Invalid input(s): PROCALCITONIN,  WBC,  LACTICIDVEN Microbiology Recent Results (from the past 240 hour(s))  SARS Coronavirus 2 by RT PCR (hospital order, performed in [facility] hospital lab) Nasopharyngeal Nasopharyngeal Swab     Status: None   Collection Time: 05/30/20  3:47 PM   Specimen: Nasopharyngeal Swab  Result Value Ref Range Status   SARS Coronavirus 2 NEGATIVE NEGATIVE Final    Comment: (NOTE) SARS-CoV-2 target nucleic acids are NOT DETECTED.  The SARS-CoV-2 RNA is generally detectable in upper and lower respiratory specimens during the acute phase of infection. The lowest concentration of SARS-CoV-2 viral copies this assay can detect is 250 copies / mL. A negative result does not preclude SARS-CoV-2 infection and should not be  used as the sole basis for treatment or other patient management decisions.  A negative result may occur with improper specimen collection / handling, submission of specimen other than nasopharyngeal swab, presence of viral mutation(s) within the areas targeted by this assay, and inadequate number of viral copies (<250 copies / mL). A negative result must be combined with clinical observations, patient history, and epidemiological information.  Fact Sheet for Patients:   BoilerBrush.com.cy  Fact Sheet for Healthcare Providers: https://pope.com/  This test is not yet approved or  cleared by the [place] FDA and has been authorized for detection and/or diagnosis of SARS-CoV-2 by FDA under an Emergency Use Authorization (EUA).  This EUA will remain in effect (meaning this test can be used) for the duration of the COVID-19 declaration under Section 564(b)(1) of the Act, 21 U.S.C. section 360bbb-3(b)(1), unless the authorization is terminated or revoked sooner.  Performed at [facility], [address], [place], [place] [address]      Time coordinating discharge: Over 30 minutes  SIGNED:   Cipriano Bunker, MD  [facility] 06/01/2020, 12:32 PM Pager   If 7PM-7AM, please contact night-coverage [email or web address]

## 2020-07-01 ENCOUNTER — Other Ambulatory Visit: Payer: Self-pay | Admitting: Family Medicine

## 2020-07-01 DIAGNOSIS — Z1231 Encounter for screening mammogram for malignant neoplasm of breast: Secondary | ICD-10-CM

## 2020-07-21 ENCOUNTER — Ambulatory Visit
Admission: RE | Admit: 2020-07-21 | Discharge: 2020-07-21 | Disposition: A | Discharge status: Home / Self Care | Payer: BC Managed Care – PPO | Source: Ambulatory Visit | Attending: Family Medicine | Admitting: Family Medicine

## 2020-07-21 ENCOUNTER — Other Ambulatory Visit: Payer: Self-pay

## 2020-07-21 DIAGNOSIS — Z1231 Encounter for screening mammogram for malignant neoplasm of breast: Secondary | ICD-10-CM

## 2020-08-16 DIAGNOSIS — F339 Major depressive disorder, recurrent, unspecified: Secondary | ICD-10-CM | POA: Diagnosis not present

## 2020-08-16 DIAGNOSIS — F1020 Alcohol dependence, uncomplicated: Secondary | ICD-10-CM | POA: Diagnosis not present

## 2020-08-16 DIAGNOSIS — I10 Essential (primary) hypertension: Secondary | ICD-10-CM | POA: Diagnosis not present

## 2020-08-16 DIAGNOSIS — F5101 Primary insomnia: Secondary | ICD-10-CM | POA: Diagnosis not present

## 2020-08-23 DIAGNOSIS — R634 Abnormal weight loss: Secondary | ICD-10-CM | POA: Diagnosis not present

## 2020-10-29 DIAGNOSIS — F411 Generalized anxiety disorder: Secondary | ICD-10-CM | POA: Diagnosis not present

## 2020-11-25 DIAGNOSIS — F410 Panic disorder [episodic paroxysmal anxiety] without agoraphobia: Secondary | ICD-10-CM | POA: Diagnosis not present

## 2020-11-25 DIAGNOSIS — G4709 Other insomnia: Secondary | ICD-10-CM | POA: Diagnosis not present

## 2020-11-25 DIAGNOSIS — F1021 Alcohol dependence, in remission: Secondary | ICD-10-CM | POA: Diagnosis not present

## 2020-11-25 DIAGNOSIS — F411 Generalized anxiety disorder: Secondary | ICD-10-CM | POA: Diagnosis not present

## 2020-11-30 DIAGNOSIS — H25043 Posterior subcapsular polar age-related cataract, bilateral: Secondary | ICD-10-CM | POA: Diagnosis not present

## 2020-11-30 DIAGNOSIS — H2511 Age-related nuclear cataract, right eye: Secondary | ICD-10-CM | POA: Diagnosis not present

## 2020-11-30 DIAGNOSIS — H25013 Cortical age-related cataract, bilateral: Secondary | ICD-10-CM | POA: Diagnosis not present

## 2020-11-30 DIAGNOSIS — H35372 Puckering of macula, left eye: Secondary | ICD-10-CM | POA: Diagnosis not present

## 2020-11-30 DIAGNOSIS — H2513 Age-related nuclear cataract, bilateral: Secondary | ICD-10-CM | POA: Diagnosis not present

## 2020-11-30 DIAGNOSIS — H18413 Arcus senilis, bilateral: Secondary | ICD-10-CM | POA: Diagnosis not present

## 2020-12-17 DIAGNOSIS — H2512 Age-related nuclear cataract, left eye: Secondary | ICD-10-CM | POA: Diagnosis not present

## 2020-12-17 DIAGNOSIS — H2511 Age-related nuclear cataract, right eye: Secondary | ICD-10-CM | POA: Diagnosis not present

## 2021-01-09 IMAGING — MG MM DIGITAL SCREENING BILAT W/ TOMO W/ CAD
8 series · 8 of 24 positions shown · non-contrast
Comparison: Previous exam(s).

CLINICAL DATA: Screening.

EXAM:
DIGITAL SCREENING BILATERAL MAMMOGRAM WITH TOMO AND CAD

[L CC synth-2D]
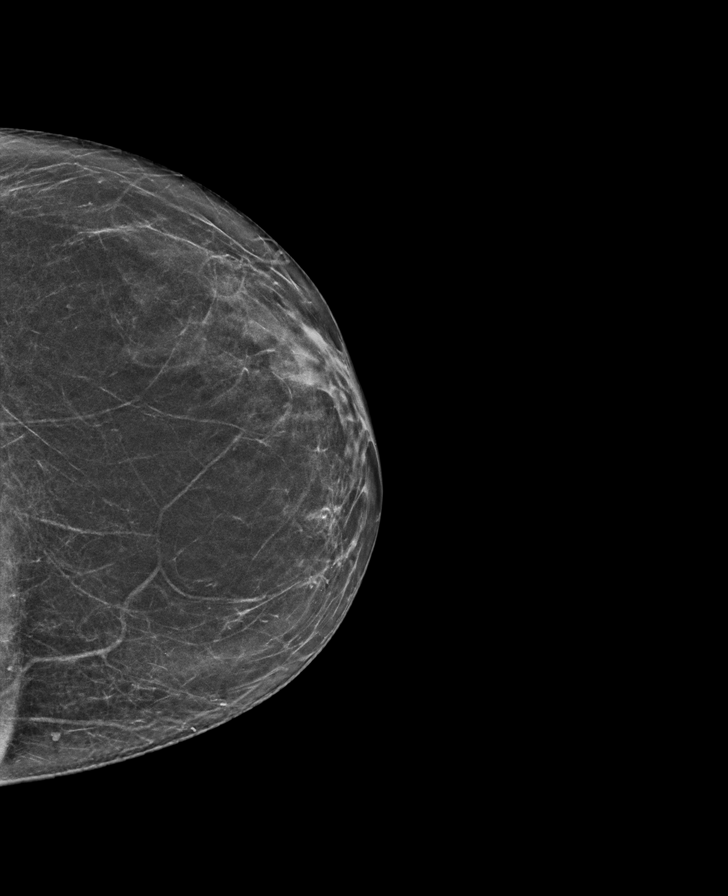

[L MLO synth-2D]
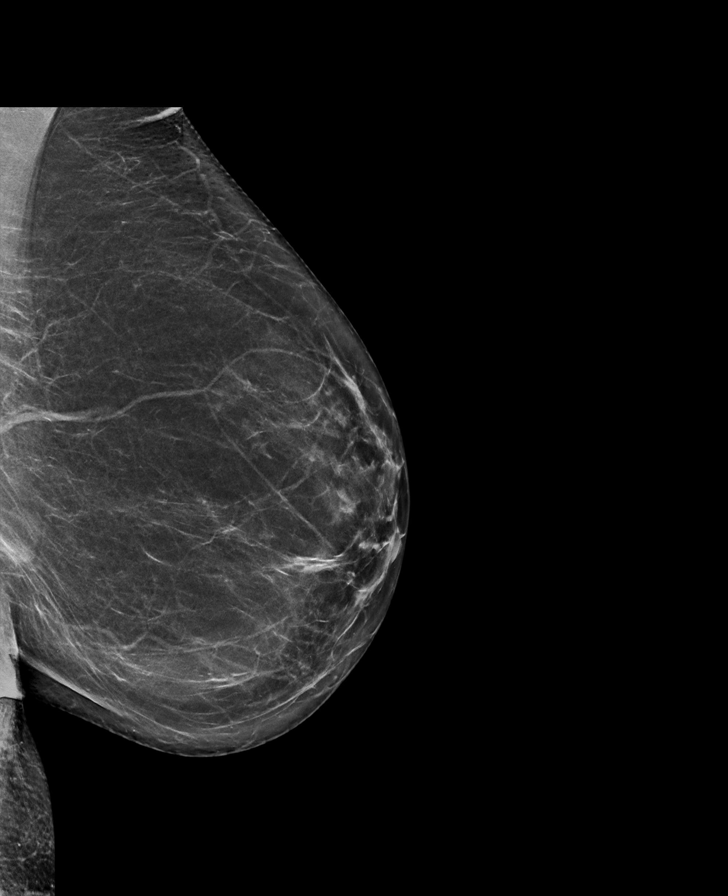

[R CC synth-2D]
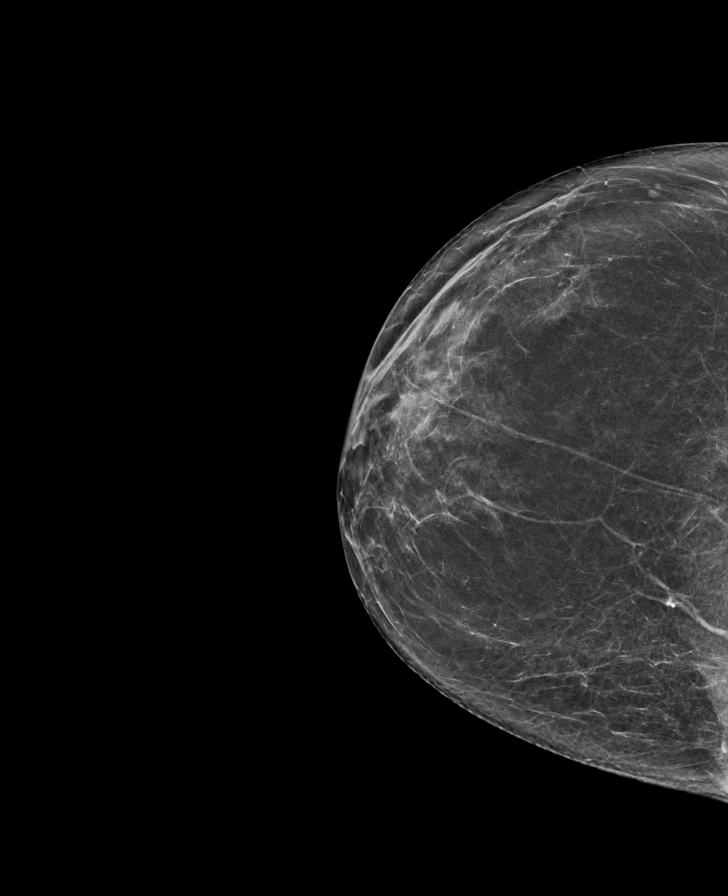

[R MLO synth-2D]
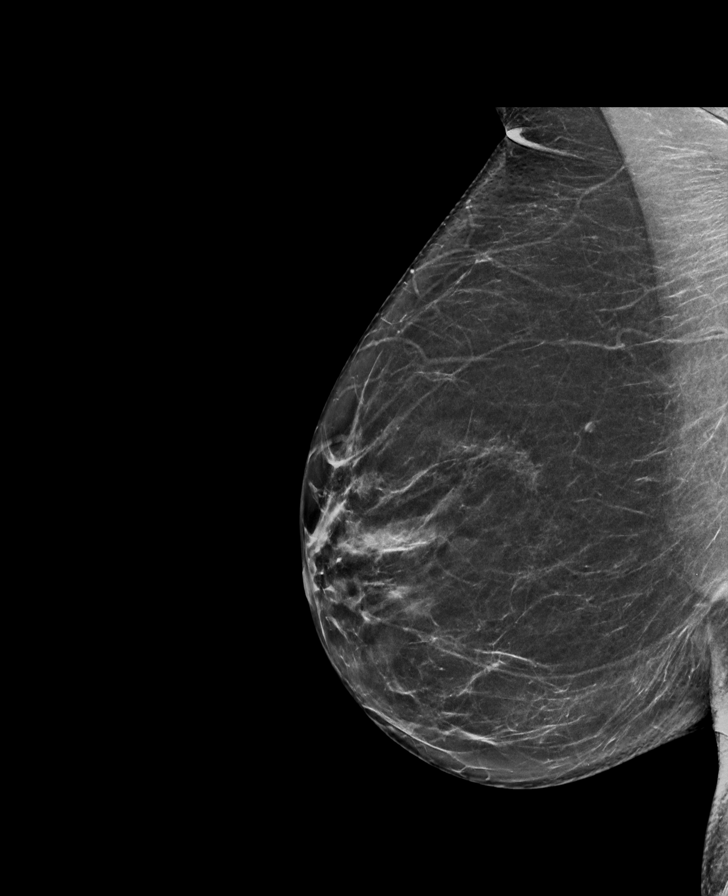

[L MLO tomo · tomo slice 35/69.0]
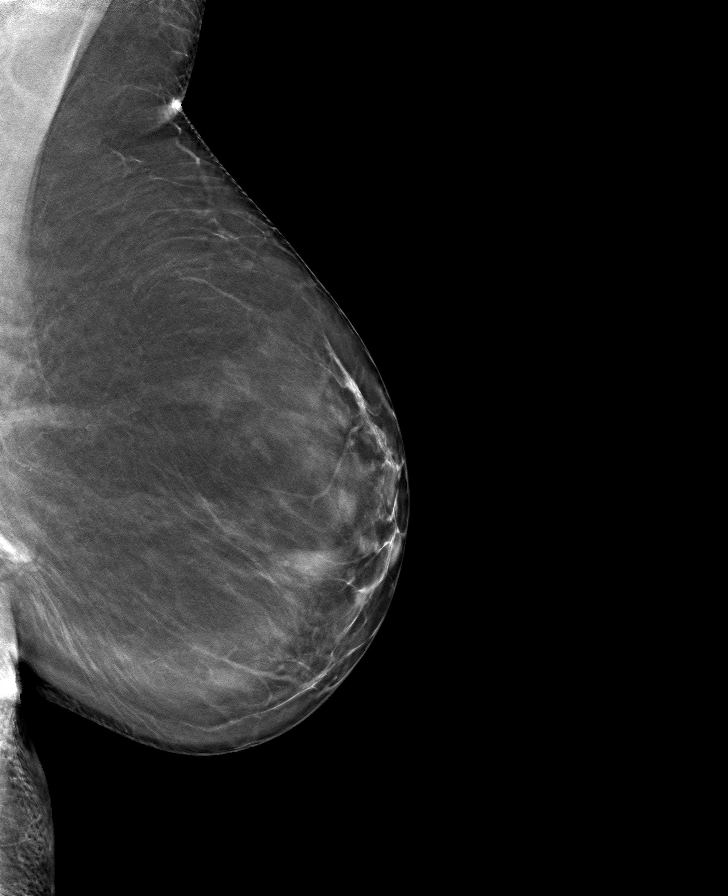

[L CC tomo · tomo slice 31/61.0]
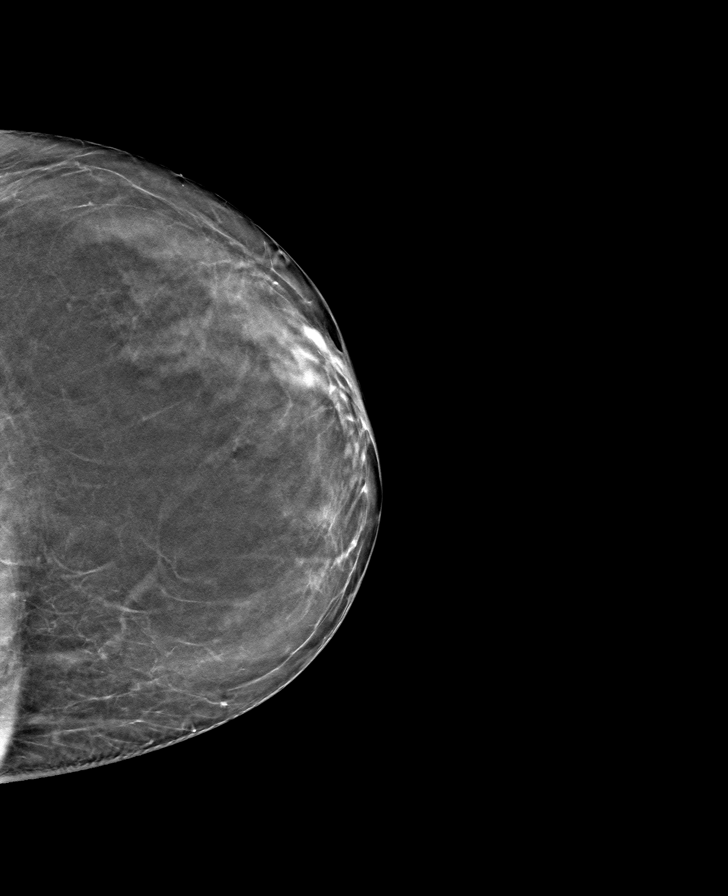

[R MLO tomo · tomo slice 37/74.0]
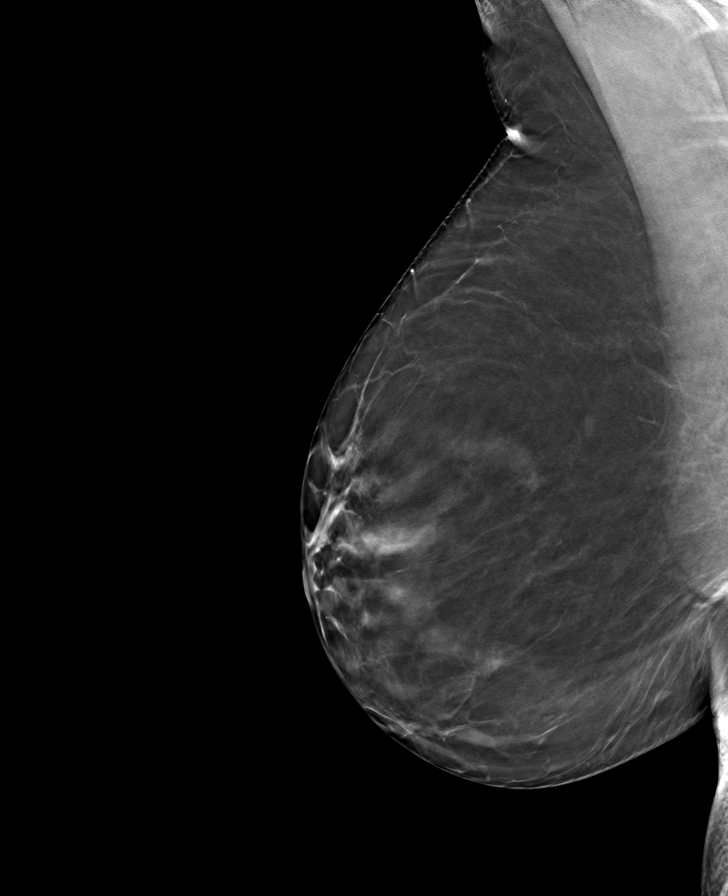

[R CC tomo · tomo slice 33/65.0]
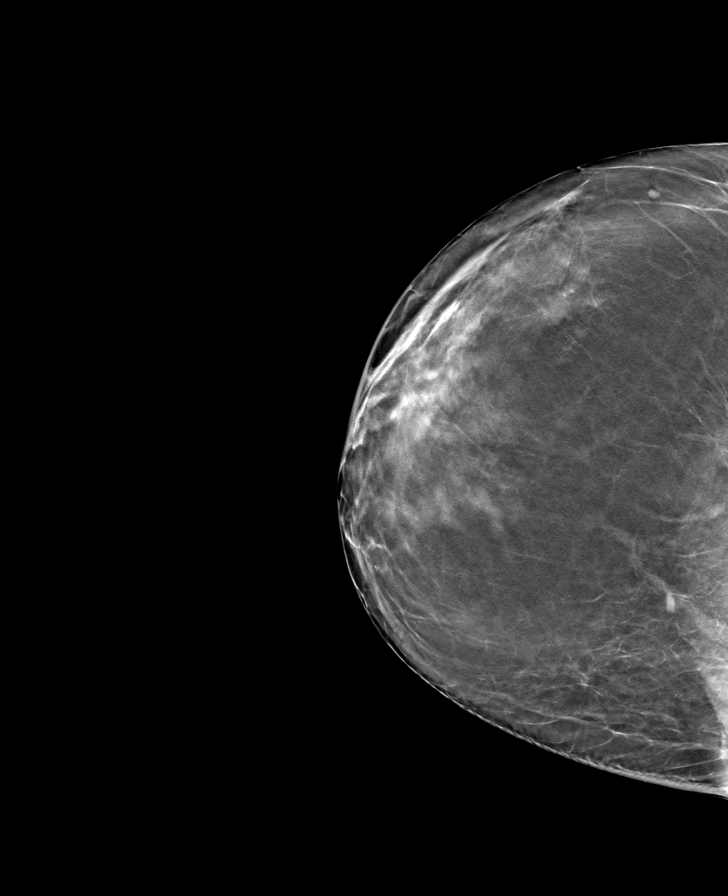

[8 of 24 positions shown; findings below may reference images not displayed]

ACR Breast Density Category b: There are scattered areas of
fibroglandular density.
FINDINGS: There are no findings suspicious for malignancy. Images were
processed with CAD.
IMPRESSION: No mammographic evidence of malignancy. A result letter of this
screening mammogram will be mailed directly to the patient.

RECOMMENDATION:
Screening mammogram in one year. (Code:[id number])

BI-RADS CATEGORY  1: Negative.

## 2021-01-14 DIAGNOSIS — H2512 Age-related nuclear cataract, left eye: Secondary | ICD-10-CM | POA: Diagnosis not present

## 2021-07-08 ENCOUNTER — Other Ambulatory Visit: Payer: Self-pay | Admitting: Family Medicine

## 2021-07-08 DIAGNOSIS — Z1231 Encounter for screening mammogram for malignant neoplasm of breast: Secondary | ICD-10-CM

## 2021-07-29 ENCOUNTER — Ambulatory Visit
Admission: RE | Admit: 2021-07-29 | Discharge: 2021-07-29 | Disposition: A | Discharge status: Home / Self Care | Payer: BC Managed Care – PPO | Source: Ambulatory Visit | Attending: Family Medicine | Admitting: Family Medicine

## 2021-07-29 ENCOUNTER — Other Ambulatory Visit: Payer: Self-pay

## 2021-07-29 DIAGNOSIS — Z1231 Encounter for screening mammogram for malignant neoplasm of breast: Secondary | ICD-10-CM | POA: Diagnosis not present

## 2021-08-01 DIAGNOSIS — F1021 Alcohol dependence, in remission: Secondary | ICD-10-CM | POA: Diagnosis not present

## 2021-08-01 DIAGNOSIS — F411 Generalized anxiety disorder: Secondary | ICD-10-CM | POA: Diagnosis not present

## 2021-08-01 DIAGNOSIS — F410 Panic disorder [episodic paroxysmal anxiety] without agoraphobia: Secondary | ICD-10-CM | POA: Diagnosis not present

## 2021-09-27 DIAGNOSIS — Z0000 Encounter for general adult medical examination without abnormal findings: Secondary | ICD-10-CM | POA: Diagnosis not present

## 2021-09-27 DIAGNOSIS — E785 Hyperlipidemia, unspecified: Secondary | ICD-10-CM | POA: Diagnosis not present

## 2021-09-27 DIAGNOSIS — I10 Essential (primary) hypertension: Secondary | ICD-10-CM | POA: Diagnosis not present

## 2021-10-19 DIAGNOSIS — L720 Epidermal cyst: Secondary | ICD-10-CM | POA: Diagnosis not present

## 2021-10-19 DIAGNOSIS — L820 Inflamed seborrheic keratosis: Secondary | ICD-10-CM | POA: Diagnosis not present

## 2022-01-25 DIAGNOSIS — F410 Panic disorder [episodic paroxysmal anxiety] without agoraphobia: Secondary | ICD-10-CM | POA: Diagnosis not present

## 2022-01-25 DIAGNOSIS — F411 Generalized anxiety disorder: Secondary | ICD-10-CM | POA: Diagnosis not present

## 2022-01-25 DIAGNOSIS — F1021 Alcohol dependence, in remission: Secondary | ICD-10-CM | POA: Diagnosis not present

## 2022-03-20 DIAGNOSIS — Z86010 Personal history of colonic polyps: Secondary | ICD-10-CM | POA: Diagnosis not present

## 2022-03-30 DIAGNOSIS — Z79899 Other long term (current) drug therapy: Secondary | ICD-10-CM | POA: Diagnosis not present

## 2022-03-30 DIAGNOSIS — F419 Anxiety disorder, unspecified: Secondary | ICD-10-CM | POA: Diagnosis not present

## 2022-03-30 DIAGNOSIS — F5101 Primary insomnia: Secondary | ICD-10-CM | POA: Diagnosis not present

## 2022-03-30 DIAGNOSIS — K219 Gastro-esophageal reflux disease without esophagitis: Secondary | ICD-10-CM | POA: Diagnosis not present

## 2022-03-30 DIAGNOSIS — I10 Essential (primary) hypertension: Secondary | ICD-10-CM | POA: Diagnosis not present

## 2022-05-04 DIAGNOSIS — Z86010 Personal history of colonic polyps: Secondary | ICD-10-CM | POA: Diagnosis not present

## 2022-05-04 DIAGNOSIS — K648 Other hemorrhoids: Secondary | ICD-10-CM | POA: Diagnosis not present

## 2022-05-04 DIAGNOSIS — K635 Polyp of colon: Secondary | ICD-10-CM | POA: Diagnosis not present

## 2022-05-04 DIAGNOSIS — K5730 Diverticulosis of large intestine without perforation or abscess without bleeding: Secondary | ICD-10-CM | POA: Diagnosis not present

## 2022-06-21 ENCOUNTER — Other Ambulatory Visit: Payer: Self-pay | Admitting: Family Medicine

## 2022-06-21 DIAGNOSIS — Z1231 Encounter for screening mammogram for malignant neoplasm of breast: Secondary | ICD-10-CM

## 2022-07-31 ENCOUNTER — Ambulatory Visit: Payer: BC Managed Care – PPO

## 2022-11-27 DIAGNOSIS — Z0000 Encounter for general adult medical examination without abnormal findings: Secondary | ICD-10-CM | POA: Diagnosis not present

## 2022-11-27 DIAGNOSIS — Z23 Encounter for immunization: Secondary | ICD-10-CM | POA: Diagnosis not present

## 2022-11-27 DIAGNOSIS — R1013 Epigastric pain: Secondary | ICD-10-CM | POA: Diagnosis not present

## 2022-11-27 DIAGNOSIS — I10 Essential (primary) hypertension: Secondary | ICD-10-CM | POA: Diagnosis not present

## 2022-11-27 DIAGNOSIS — F1021 Alcohol dependence, in remission: Secondary | ICD-10-CM | POA: Diagnosis not present

## 2022-11-27 DIAGNOSIS — F5101 Primary insomnia: Secondary | ICD-10-CM | POA: Diagnosis not present

## 2022-12-12 DIAGNOSIS — I10 Essential (primary) hypertension: Secondary | ICD-10-CM | POA: Diagnosis not present

## 2022-12-12 DIAGNOSIS — R1013 Epigastric pain: Secondary | ICD-10-CM | POA: Diagnosis not present

## 2022-12-12 DIAGNOSIS — E7800 Pure hypercholesterolemia, unspecified: Secondary | ICD-10-CM | POA: Diagnosis not present
